# Patient Record
Sex: Female | Born: 1980 | Marital: Single | Smoking: Never smoker
Health system: Southern US, Community
[De-identification: ages and names within clinical notes are randomized; demographics above are authoritative.]

## PROBLEM LIST (undated history)

## (undated) ENCOUNTER — Emergency Department (HOSPITAL_COMMUNITY): Admission: EM | Payer: Self-pay | Source: Home / Self Care

## (undated) DIAGNOSIS — D649 Anemia, unspecified: Secondary | ICD-10-CM

## (undated) HISTORY — DX: Anemia, unspecified: D64.9

---

## 2012-07-27 ENCOUNTER — Other Ambulatory Visit (HOSPITAL_COMMUNITY)
Admission: RE | Admit: 2012-07-27 | Discharge: 2012-07-27 | Disposition: A | Discharge status: Home / Self Care | Payer: 59 | Source: Ambulatory Visit | Attending: Family Medicine | Admitting: Family Medicine

## 2012-07-27 DIAGNOSIS — Z01419 Encounter for gynecological examination (general) (routine) without abnormal findings: Secondary | ICD-10-CM | POA: Insufficient documentation

## 2014-01-23 ENCOUNTER — Encounter (HOSPITAL_COMMUNITY): Payer: Self-pay | Admitting: Emergency Medicine

## 2014-01-23 ENCOUNTER — Emergency Department (HOSPITAL_COMMUNITY): Payer: 59

## 2014-01-23 ENCOUNTER — Emergency Department (HOSPITAL_COMMUNITY)
Admission: EM | Admit: 2014-01-23 | Discharge: 2014-01-23 | Disposition: A | Discharge status: Home / Self Care | Payer: 59 | Attending: Emergency Medicine | Admitting: Emergency Medicine

## 2014-01-23 DIAGNOSIS — Z3202 Encounter for pregnancy test, result negative: Secondary | ICD-10-CM | POA: Insufficient documentation

## 2014-01-23 DIAGNOSIS — Z9889 Other specified postprocedural states: Secondary | ICD-10-CM | POA: Insufficient documentation

## 2014-01-23 DIAGNOSIS — R1013 Epigastric pain: Secondary | ICD-10-CM | POA: Insufficient documentation

## 2014-01-23 DIAGNOSIS — R1033 Periumbilical pain: Secondary | ICD-10-CM | POA: Insufficient documentation

## 2014-01-23 DIAGNOSIS — G8929 Other chronic pain: Secondary | ICD-10-CM | POA: Insufficient documentation

## 2014-01-23 DIAGNOSIS — R109 Unspecified abdominal pain: Secondary | ICD-10-CM

## 2014-01-23 LAB — COMPREHENSIVE METABOLIC PANEL
ALBUMIN: 3.7 g/dL (ref 3.5–5.2)
ALK PHOS: 31 U/L — AB (ref 39–117)
ALT: 13 U/L (ref 0–35)
AST: 16 U/L (ref 0–37)
BILIRUBIN TOTAL: 0.4 mg/dL (ref 0.3–1.2)
BUN: 10 mg/dL (ref 6–23)
CO2: 25 meq/L (ref 19–32)
Calcium: 9.4 mg/dL (ref 8.4–10.5)
Chloride: 100 mEq/L (ref 96–112)
Creatinine, Ser: 0.68 mg/dL (ref 0.50–1.10)
GFR calc Af Amer: >90 mL/min (ref >90)
Glucose, Bld: 83 mg/dL (ref 70–99)
POTASSIUM: 4.4 meq/L (ref 3.7–5.3)
Sodium: 135 mEq/L — ABNORMAL LOW (ref 137–147)
Total Protein: 7.2 g/dL (ref 6.0–8.3)

## 2014-01-23 LAB — URINALYSIS, ROUTINE W REFLEX MICROSCOPIC
Bilirubin Urine: NEGATIVE
Glucose, UA: NEGATIVE mg/dL
Hgb urine dipstick: NEGATIVE
Ketones, ur: NEGATIVE mg/dL
Leukocytes, UA: NEGATIVE
Nitrite: NEGATIVE
Protein, ur: NEGATIVE mg/dL
Specific Gravity, Urine: 1.006 (ref 1.005–1.030)
Urobilinogen, UA: 0.2 mg/dL (ref 0.0–1.0)
pH: 7.5 (ref 5.0–8.0)

## 2014-01-23 LAB — CBC
HEMATOCRIT: 37.9 % (ref 36.0–46.0)
Hemoglobin: 12.6 g/dL (ref 12.0–15.0)
MCH: 24 pg — ABNORMAL LOW (ref 26.0–34.0)
MCHC: 33.2 g/dL (ref 30.0–36.0)
MCV: 72.3 fL — AB (ref 78.0–100.0)
PLATELETS: 285 10*3/uL (ref 150–400)
RBC: 5.24 MIL/uL — ABNORMAL HIGH (ref 3.87–5.11)
RDW: 13.5 % (ref 11.5–15.5)
WBC: 5.9 10*3/uL (ref 4.0–10.5)

## 2014-01-23 LAB — PREGNANCY, URINE: PREG TEST UR: NEGATIVE

## 2014-01-23 LAB — LIPASE, BLOOD: Lipase: 34 U/L (ref 11–59)

## 2014-01-23 MED ORDER — ONDANSETRON HCL 4 MG PO TABS: 4.0000 mg | ORAL_TABLET | Freq: Four times a day (QID) | ORAL | Status: DC

## 2014-01-23 MED ORDER — ONDANSETRON 4 MG PO TBDP
4.0000 mg | ORAL_TABLET | Freq: Once | ORAL | Status: AC
  Administered 2014-01-23: 4 mg via ORAL
  Filled 2014-01-23: qty 1

## 2014-01-23 MED ORDER — RANITIDINE HCL 150 MG PO TABS: 150.0000 mg | ORAL_TABLET | Freq: Two times a day (BID) | ORAL | Status: DC

## 2014-01-23 NOTE — ED Notes (Signed)
Pt c/o intermittent abd pain for a few years, denies n/v/d. Denies vaginal bleeding or discharge. Last BM 01/22/14

## 2014-01-23 NOTE — Discharge Instructions (Signed)
Return to the ED with any concerns including vomiting and not able to keep down liquids, worsening pain- especially if it localizes to the right lower abdomen, or any other alarming symptoms

## 2014-01-23 NOTE — ED Provider Notes (Signed)
CSN: [id number]     Arrival date & time 01/23/14  1243 History   First MD Initiated Contact with Patient 01/23/14 1322     Chief Complaint  Patient presents with  . Abdominal Pain     (Consider location/radiation/quality/duration/timing/severity/associated sxs/prior Treatment) HPI Pt presenting with c/o intermittent abdominal pain.  Sh estates she has been having these symptoms over the past several years. Pain is epigastric and periumblicial when it is present.  She states she feels that she has increased gas during these times.  Does not feel it is worse with certain foods or alcohol.  No vomiting but sometimes has nausea.  No changes in stools- she states she has one formed stool daily.  No fever/chills.  Last night the pain came again which prompted ED visit today.  No current abdominal pain.  No dysuria.  No vaginal discharge or vaginal bleeding.  There are no other associated systemic symptoms, there are no other alleviating or modifying factors.   History reviewed. No pertinent past medical history. Past Surgical History  Procedure Laterality Date  . Cesarean section     No family history on file. History  Substance Use Topics  . Smoking status: Passive Smoke Exposure - Never Smoker  . Smokeless tobacco: Not on file  . Alcohol Use: No   OB History   Grav Para Term Preterm Abortions TAB SAB Ect Mult Living                 Review of Systems ROS reviewed and all otherwise negative except for mentioned in HPI    Allergies  Review of patient's allergies indicates no known allergies.  Home Medications   Prior to Admission medications   Not on File   BP 103/65  Pulse 99  Temp(Src) 98.5 F (36.9 C) (Oral)  Resp 20  SpO2 97%  LMP 12/26/2013 Vitals reviewed Physical Exam Physical Examination: General appearance - alert, well appearing, and in no distress Mental status - alert, oriented to person, place, and time Eyes - no conjunctval injection, no scleral  icterus Mouth - mucous membranes moist, pharynx normal without lesions Chest - clear to auscultation, no wheezes, rales or rhonchi, symmetric air entry Heart - normal rate, regular rhythm, normal S1, S2, no murmurs, rubs, clicks or gallops Abdomen - soft, nontender, nondistended, no masses or organomegaly, nabs Extremities - peripheral pulses normal, no pedal edema, no clubbing or cyanosis Skin - normal coloration and turgor, no rashes  ED Course  Procedures (including critical care time) Labs Review Labs Reviewed  CBC - Abnormal; Notable for the following:    RBC 5.24 (*)    MCV 72.3 (*)    MCH 24.0 (*)    All other components within normal limits  COMPREHENSIVE METABOLIC PANEL - Abnormal; Notable for the following:    Sodium 135 (*)    Alkaline Phosphatase 31 (*)    All other components within normal limits  URINALYSIS, ROUTINE W REFLEX MICROSCOPIC  PREGNANCY, URINE  LIPASE, BLOOD    Imaging Review Dg Abd 2 Views  01/23/2014   CLINICAL DATA:  Left lower quadrant pain for 2 days.  EXAM: ABDOMEN - 2 VIEW  COMPARISON:  None.  FINDINGS: The bowel gas pattern is normal. There is no evidence of free air. No radio-opaque calculi or other significant radiographic abnormality is seen.  IMPRESSION: Negative.   Electronically Signed   By: Amie Portlandavid  Ormond M.D.   On: 01/23/2014 15:00     EKG Interpretation None  MDM   Final diagnoses:  Abdominal pain    Pt presenting with chronic intermittent abdominal pain, she has no tenderness now on exam   Patient is overall nontoxic and well hydrated in appearance. Her labs are reassuring.  abd xray is negative.  Pt advised to followup as an outpatient for further evaluation.  Will give rx for zantac and zofran.  Discharged with strict return precautions.  Pt agreeable with plan.     Ethelda Chick, MD 01/24/14 [phone]

## 2014-01-23 NOTE — ED Notes (Signed)
Patient transported to X-ray 

## 2014-01-23 NOTE — ED Notes (Signed)
MD at bedside. 

## 2014-03-28 ENCOUNTER — Other Ambulatory Visit: Payer: Self-pay | Admitting: Family Medicine

## 2014-03-28 DIAGNOSIS — R1011 Right upper quadrant pain: Secondary | ICD-10-CM

## 2014-04-01 ENCOUNTER — Ambulatory Visit
Admission: RE | Admit: 2014-04-01 | Discharge: 2014-04-01 | Disposition: A | Discharge status: Home / Self Care | Payer: 59 | Source: Ambulatory Visit | Attending: Family Medicine | Admitting: Family Medicine

## 2014-04-01 DIAGNOSIS — R1011 Right upper quadrant pain: Secondary | ICD-10-CM

## 2014-07-23 IMAGING — CR DG ABDOMEN 2V
2 series · 2 of 2 positions shown · non-contrast
Comparison: None.

CLINICAL DATA: Left lower quadrant pain for 2 days.

EXAM:
ABDOMEN - 2 VIEW

[w abdomen upright]
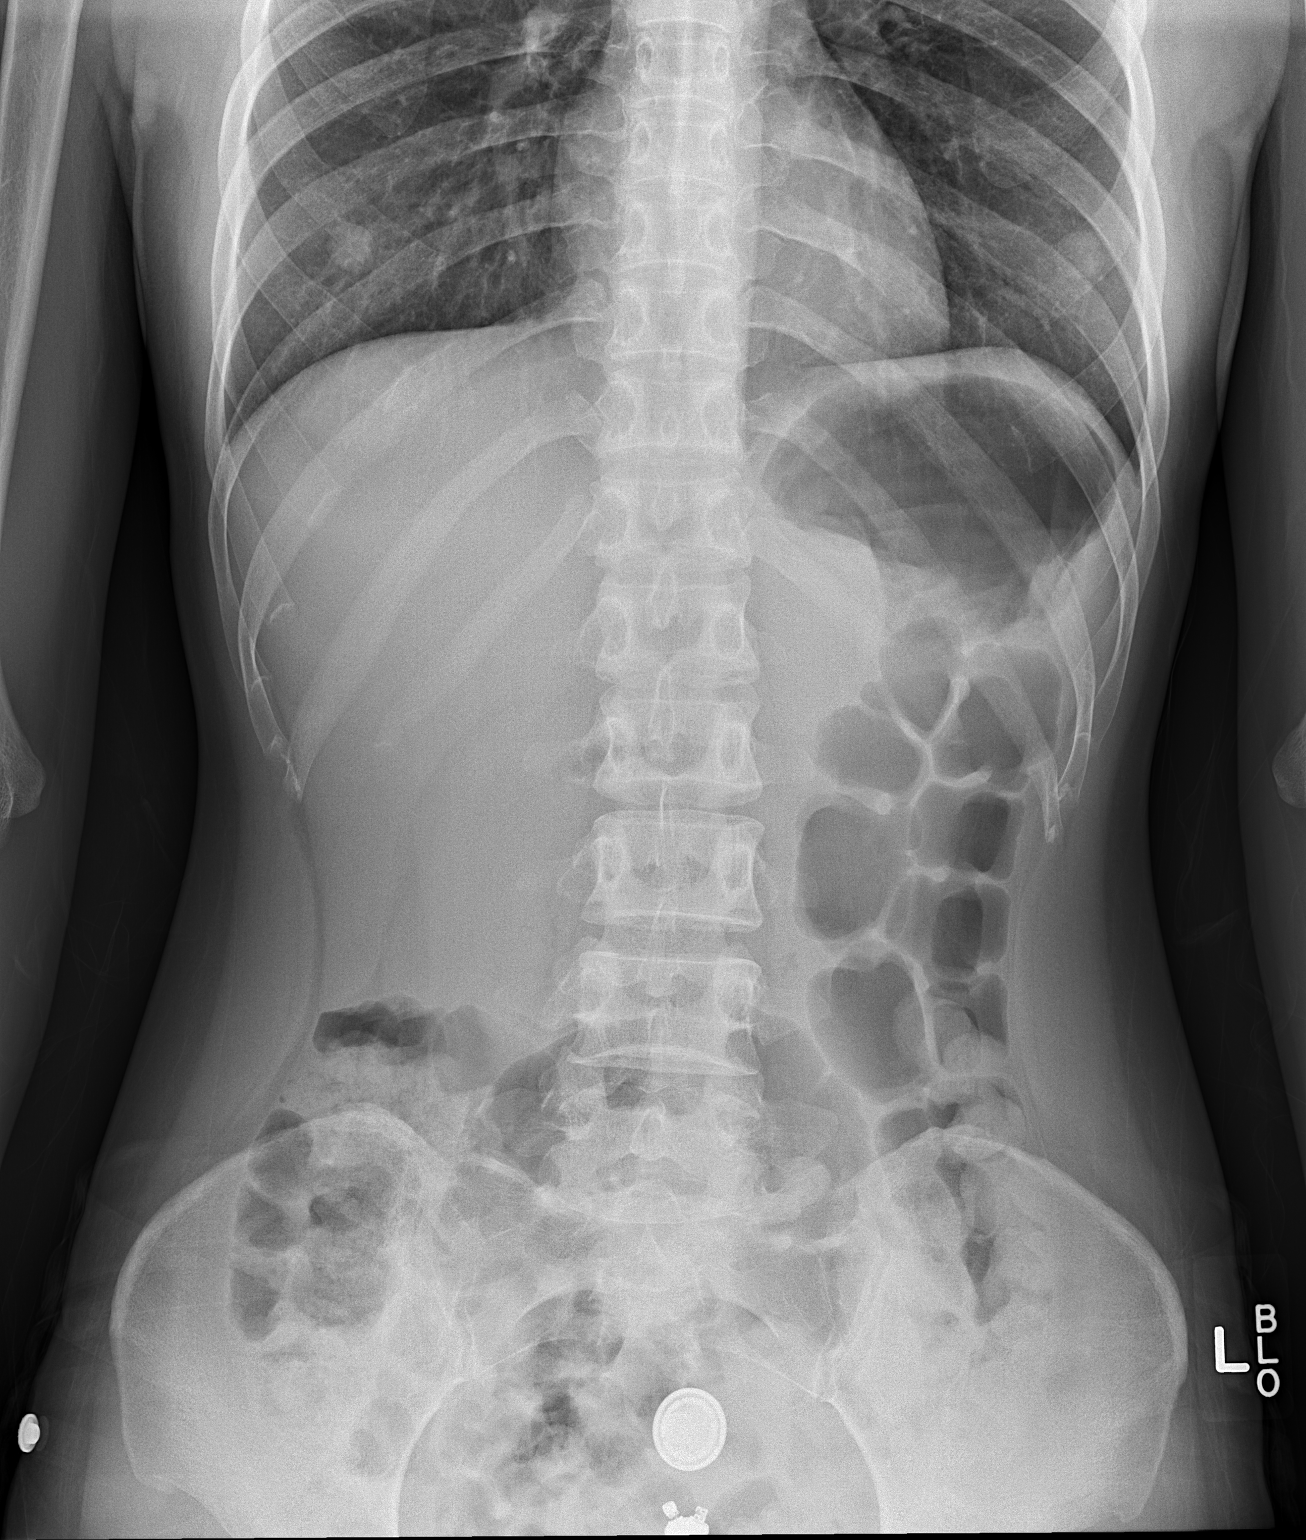

[t abdomen supine]
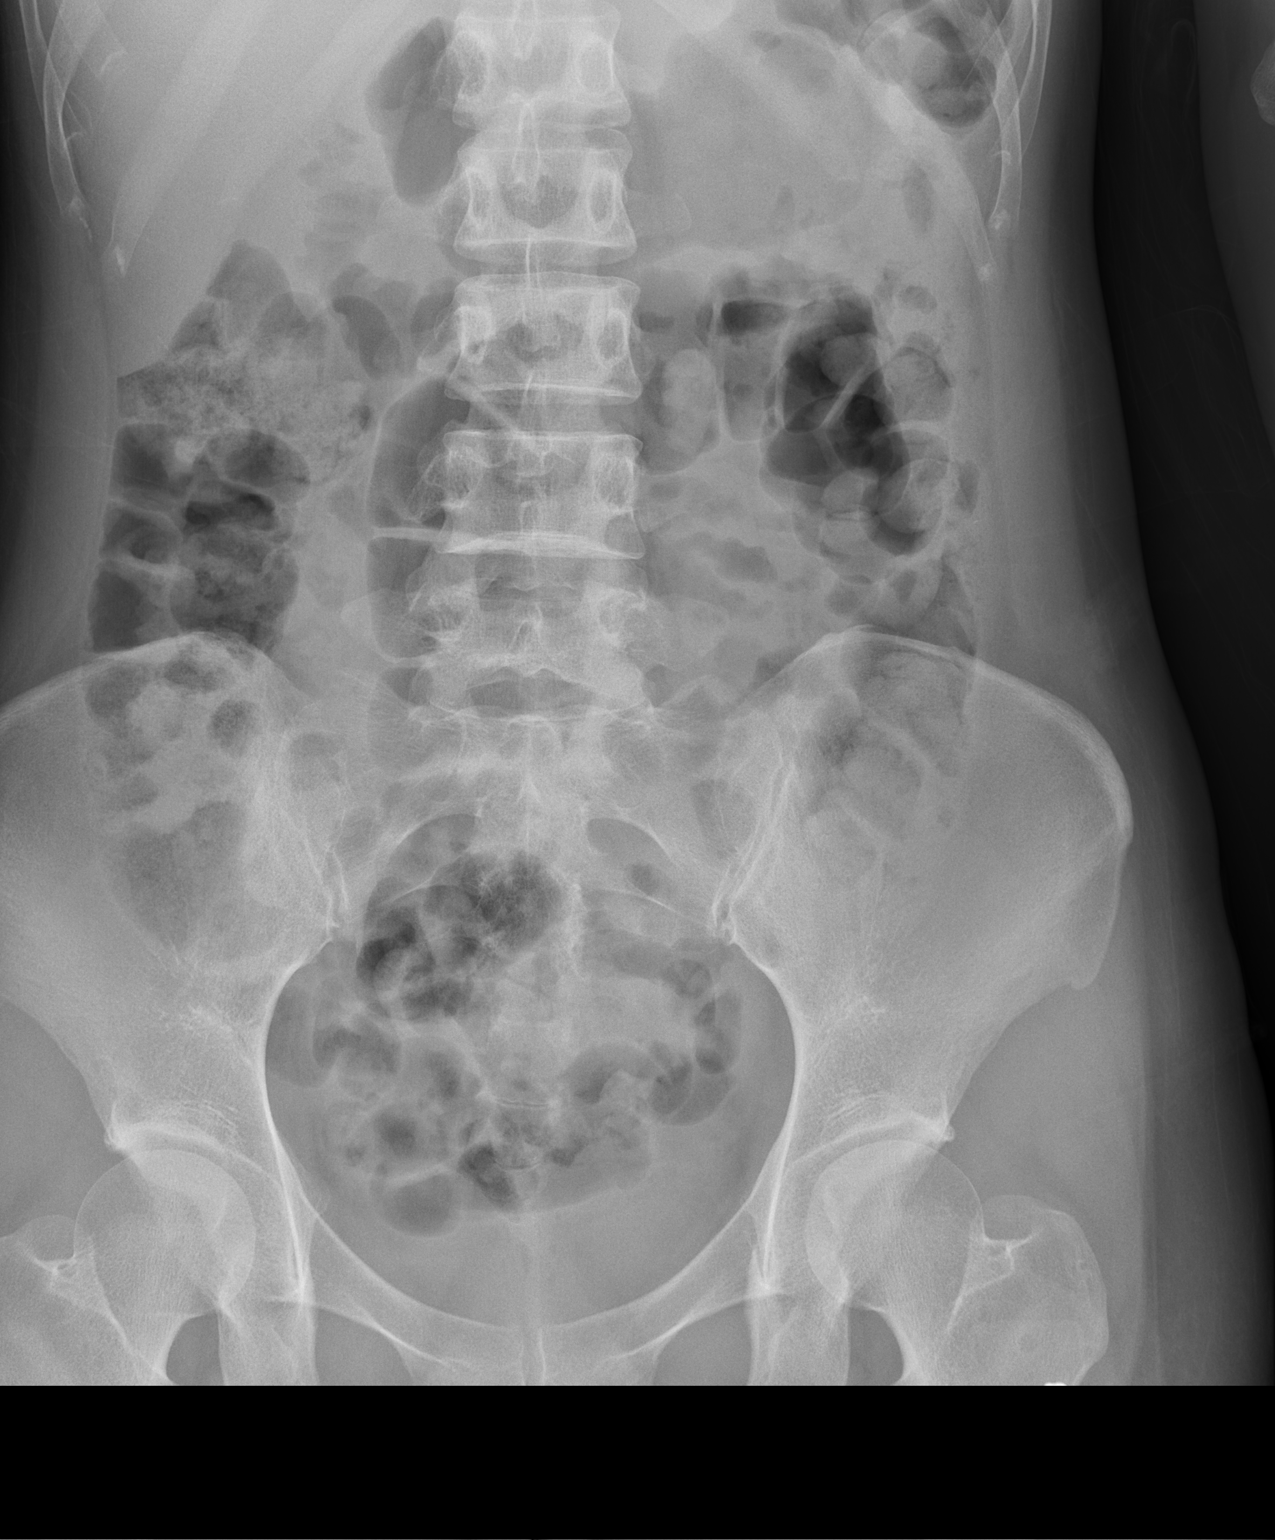

[2 of 2 positions shown; findings below may reference images not displayed]

FINDINGS: The bowel gas pattern is normal. There is no evidence of free air.
No radio-opaque calculi or other significant radiographic
abnormality is seen.
IMPRESSION: Negative.

## 2014-09-29 IMAGING — US US ABDOMEN LIMITED
1 series · 14 of 25 positions shown · non-contrast
Comparison: None.

CLINICAL DATA: Upper abdominal pain

EXAM:
US ABDOMEN LIMITED - RIGHT UPPER QUADRANT

[Series 1: us abdomen limited · 0.21mm/px · 14 of 44 slices shown]
[im 1/44]
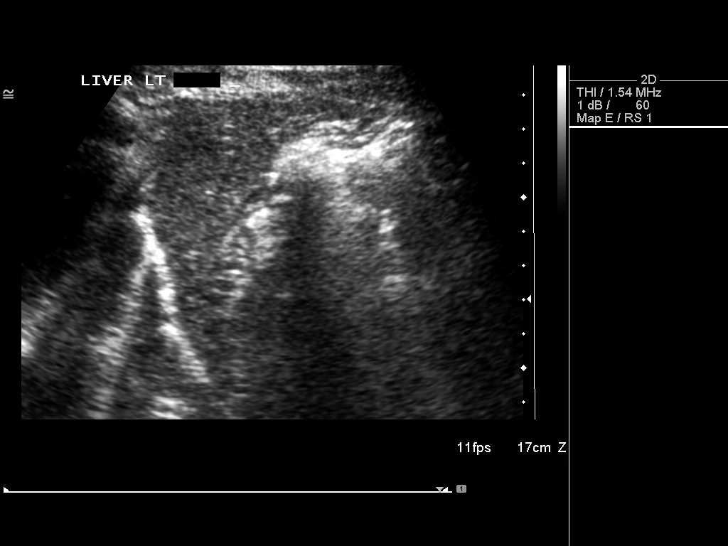
[im 4/44]
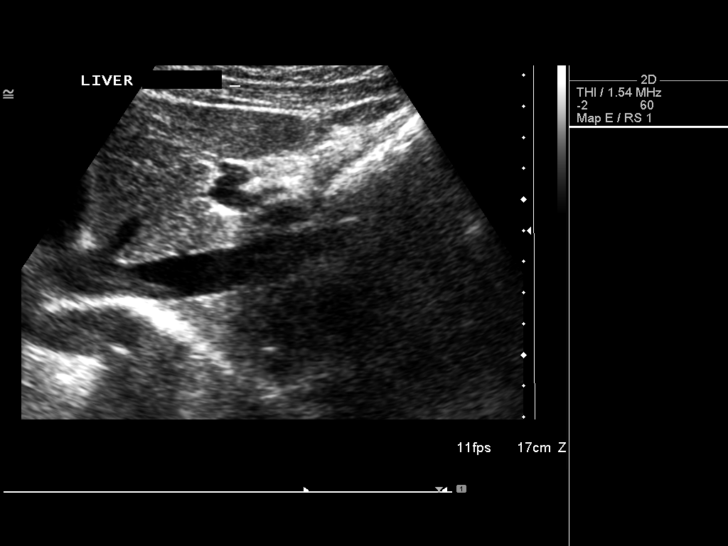
[im 8/44]
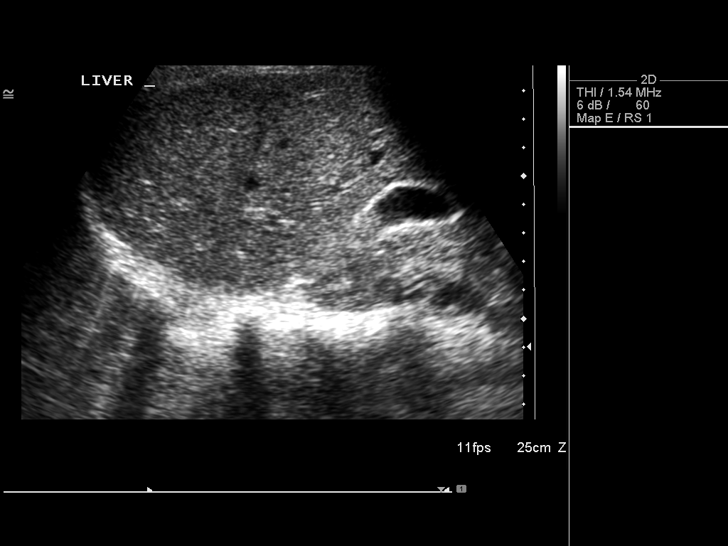
[im 11/44]
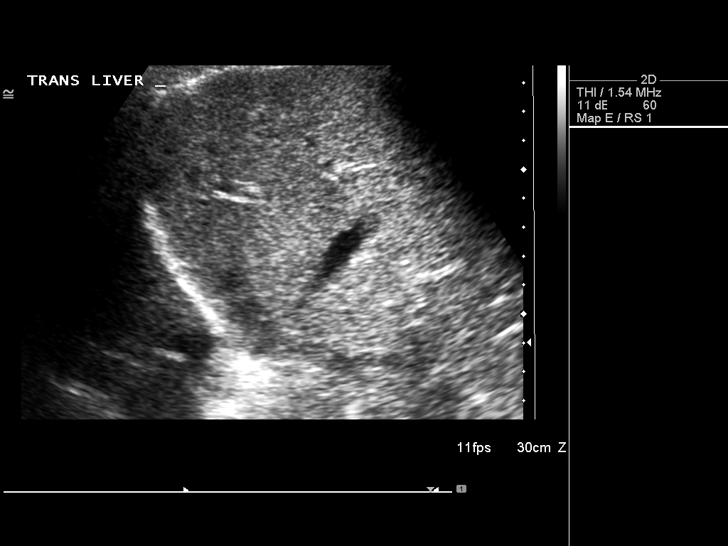
[im 15/44]
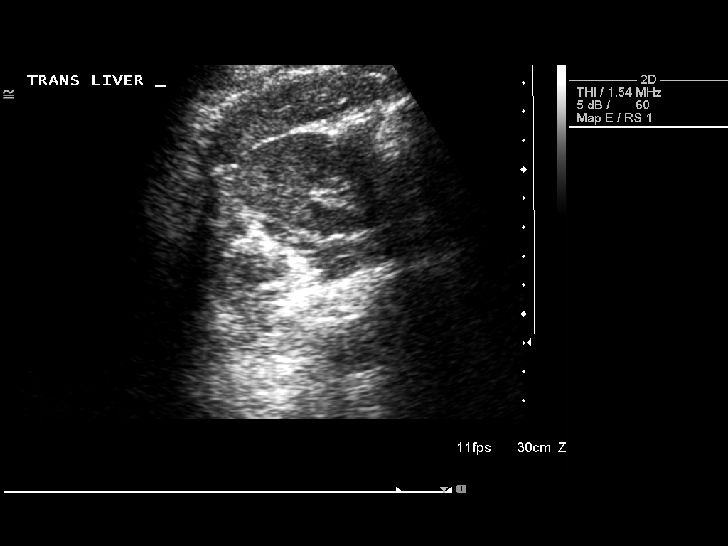
[im 17/44]
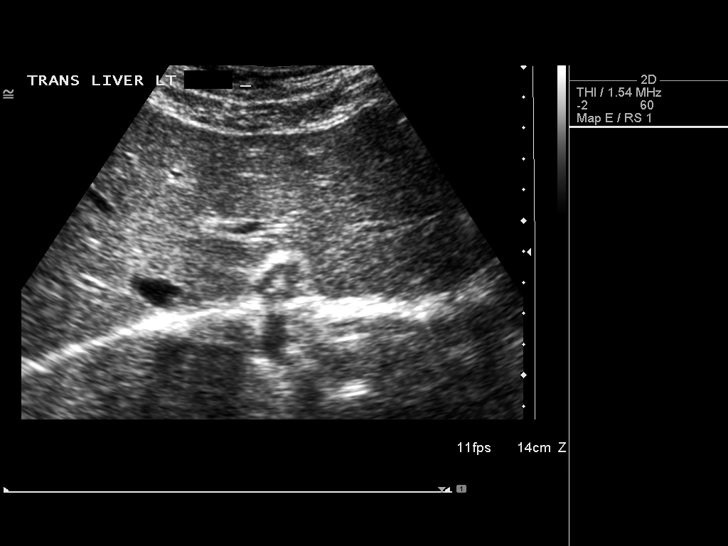
[im 20/44]
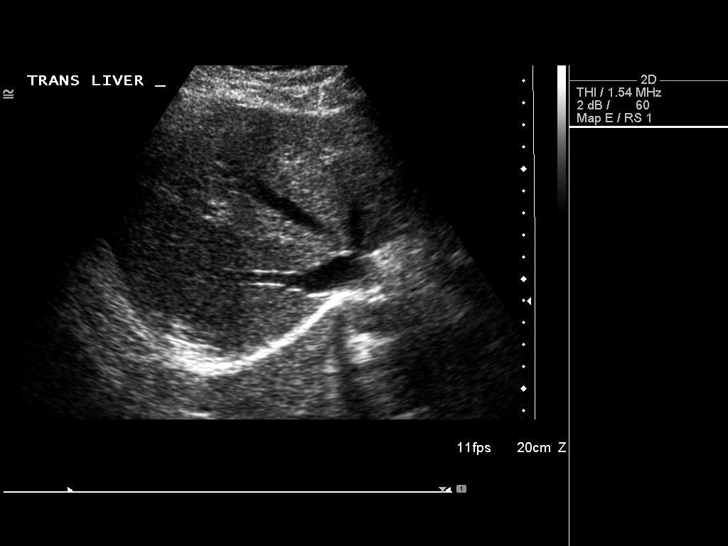
[im 24/44]
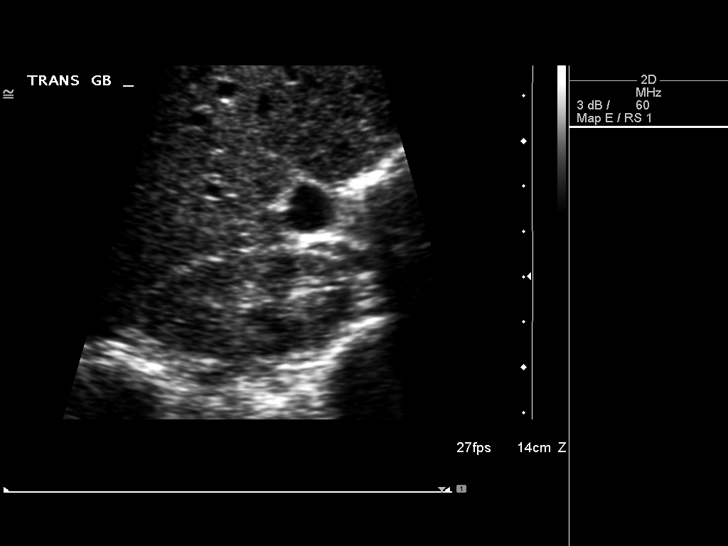
[im 27/44]
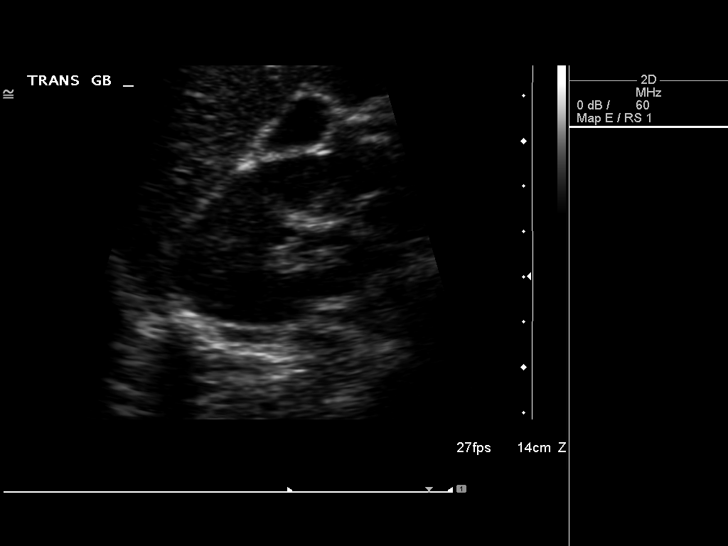
[im 29/44]
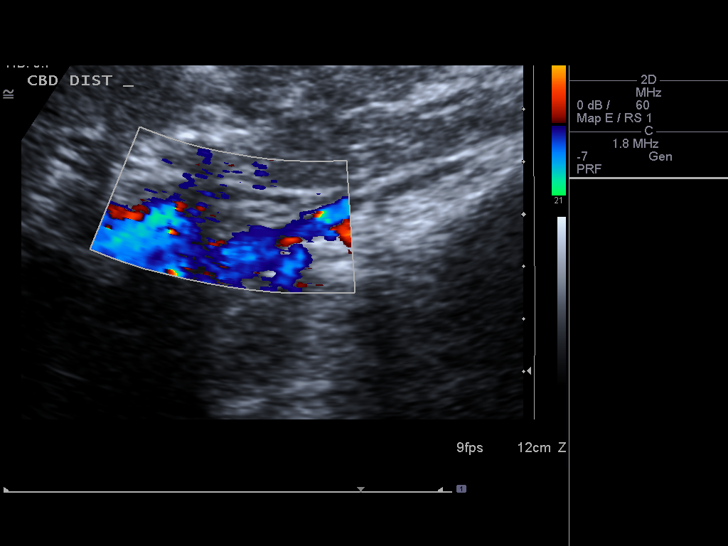
[im 33/44]
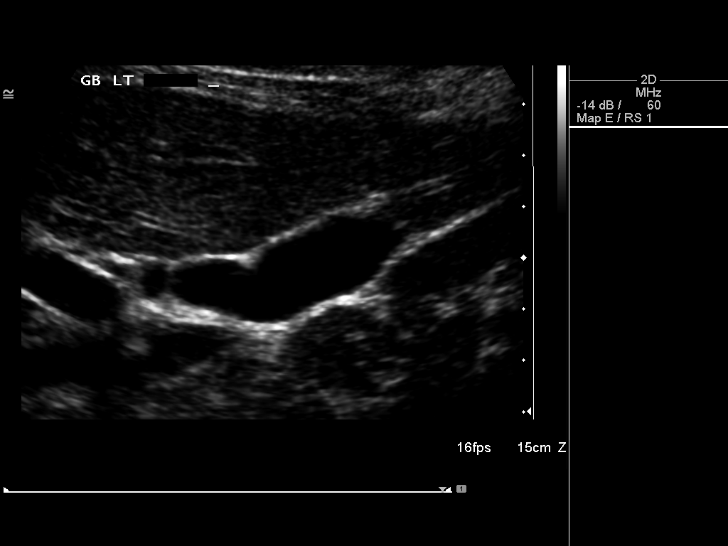
[im 36/44]
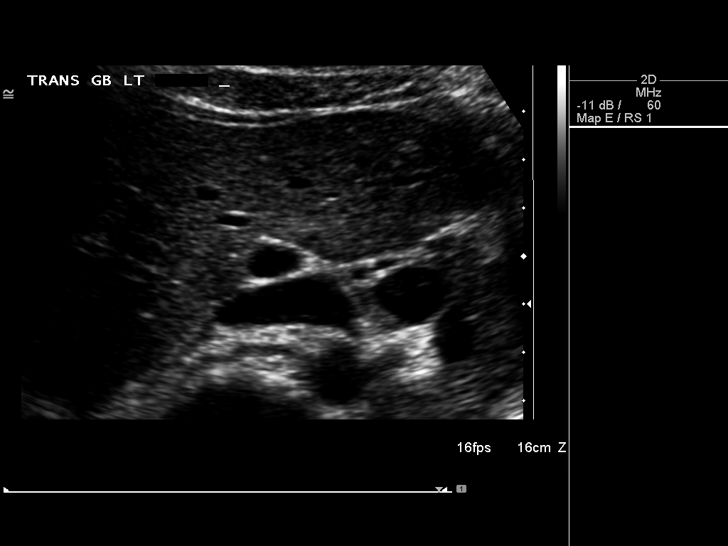
[im 40/44]
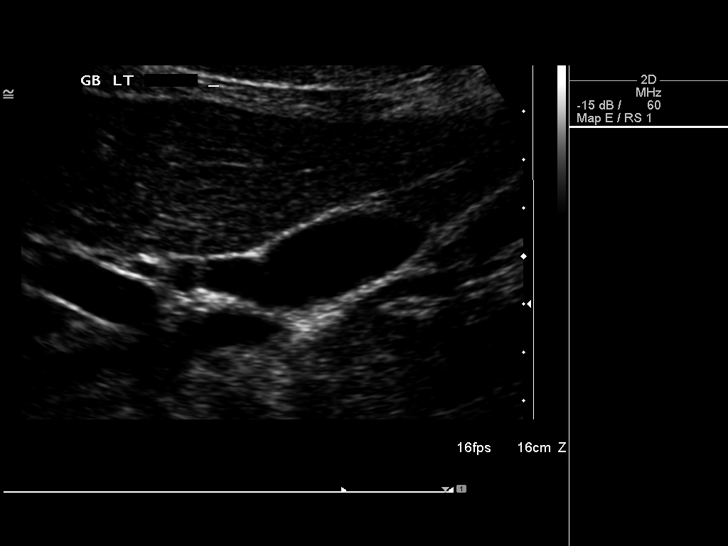
[im 44/44]
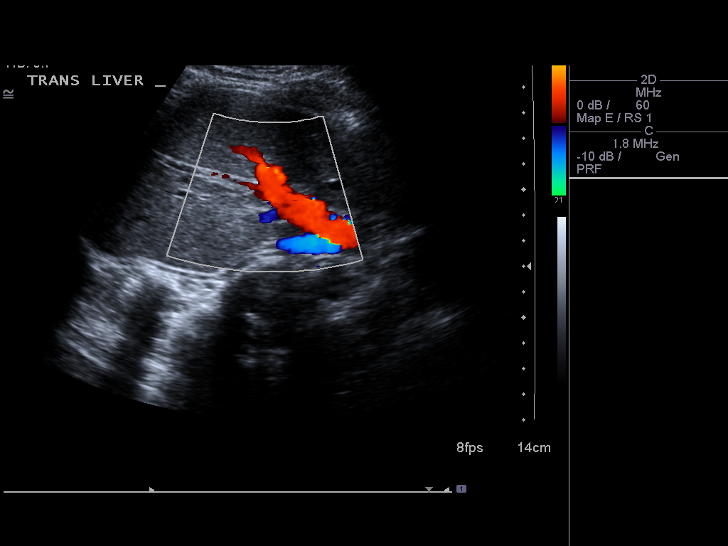

[14 of 25 positions shown; findings below may reference images not displayed]

FINDINGS: Gallbladder:

No gallstones or wall thickening visualized. There is no
pericholecystic fluid. No sonographic Murphy sign noted.

Common bile duct:

Diameter: 3 mm. There is no intrahepatic or extrahepatic biliary
duct dilatation.

Liver:

No focal lesion identified. Within normal limits in parenchymal
echogenicity.
IMPRESSION: Study within normal limits.

## 2015-09-03 HISTORY — PX: COLONOSCOPY: SHX174

## 2016-07-02 ENCOUNTER — Other Ambulatory Visit: Payer: Self-pay | Admitting: Family Medicine

## 2016-07-02 DIAGNOSIS — N644 Mastodynia: Secondary | ICD-10-CM

## 2016-07-09 ENCOUNTER — Ambulatory Visit
Admission: RE | Admit: 2016-07-09 | Discharge: 2016-07-09 | Disposition: A | Discharge status: Home / Self Care | Payer: 59 | Source: Ambulatory Visit | Attending: Family Medicine | Admitting: Family Medicine

## 2016-07-09 DIAGNOSIS — N644 Mastodynia: Secondary | ICD-10-CM

## 2016-09-23 DIAGNOSIS — Z23 Encounter for immunization: Secondary | ICD-10-CM | POA: Diagnosis not present

## 2016-09-23 DIAGNOSIS — Z0000 Encounter for general adult medical examination without abnormal findings: Secondary | ICD-10-CM | POA: Diagnosis not present

## 2016-09-23 DIAGNOSIS — G4700 Insomnia, unspecified: Secondary | ICD-10-CM | POA: Diagnosis not present

## 2017-03-26 DIAGNOSIS — L659 Nonscarring hair loss, unspecified: Secondary | ICD-10-CM | POA: Diagnosis not present

## 2017-03-26 DIAGNOSIS — D649 Anemia, unspecified: Secondary | ICD-10-CM | POA: Diagnosis not present

## 2017-09-24 NOTE — Progress Notes (Signed)
Encounter to establish care and acute visit  Subjective:    Patient ID: Kelly Macdonald, female    DOB: [birth year], 37 y.o.   MRN: [id number]  HPI 37 y.o. very pleasant young female from [name] ([place] in [place] now) presents as new patient and with c/o abdominal pain ongoing for 3-4 years now. She reports this was previously intermittent, but is now experiencing nearly daily for the last 6 months. She reports pain is variable - 5/10 - poorly localized through tends to experience in bilateral upper quadrants - she reports she commonly experiences flank pain and upper back pain simultaneously. She reports pain tends to occur at night or early morning. Not associated with eating or notably particular foods. She reports she will have pain even if she skips dinner.    She also c/o sense of bloating, abdominal fullness limiting appetite, mild nausea accompanying pain. Denies vomiting or diarrhea. She endorses ongoing constipation - currently takes what appears to be a phsyllium supplement which helps regulate bowels. She reports without this she experiences BM q3-4 days. She reports current BM are "normal" - normal shape, consistency, color (dark brown), denies hematochezia/melena, very dark black/tarry stools or mucus.   She has been evaluated for this same problem in 2015 at an ER - plain films, US and labs - CBC, CMP, lipase, urinalysis unremarkable at that time.   She reports weights are stable, denies fever/chills, fatigue/malaise, myalgias - endorses accompanying flank/upper back/shoulder pain simultaneously with abdominal discomfort. Denies rashes.   She was evaluated by physicians in [name] and had a colonoscopy 2 years ago- reports this was unremarkable. She is very concerned about gastric cancer today - she reports this has become very common in young people her age and has a friend with this.   She reports she drinks 2 bottles of water daily. [place] - still  eating typical ethnic foods, though has cut back significantly on spicy foods which did help initially.   BMI is There is no height or weight on file to calculate BMI., she has not been exercising due to this ongoing problem and discomfort.  Wt Readings from Last 3 Encounters:  09/25/17 111 lb 12.8 oz (50.7 kg)    Allergies No Known Allergies  SURGICAL HISTORY She  has a past surgical history that includes Cesarean section and Colonoscopy (2017). FAMILY HISTORY Her family history includes Diabetes in her mother. SOCIAL HISTORY She  reports that  has never smoked. she has never used smokeless tobacco. She reports that she drinks about 1.2 oz of alcohol per week. She reports that she does not use drugs.   Review of Systems  Constitutional: Positive for appetite change. Negative for activity change, chills, diaphoresis, fatigue, fever and unexpected weight change.  HENT: Negative.  Negative for sore throat, trouble swallowing and voice change.   Eyes: Negative.   Respiratory: Negative.  Negative for cough, choking, chest tightness, shortness of breath and wheezing.   Cardiovascular: Negative for chest pain, palpitations and leg swelling.  Gastrointestinal: Positive for abdominal pain, constipation and nausea. Negative for abdominal distention, anal bleeding, blood in stool, diarrhea, rectal pain and vomiting.  Endocrine: Negative for cold intolerance and heat intolerance.  Genitourinary: Positive for flank pain. Negative for decreased urine volume, difficulty urinating, dysuria, enuresis, frequency, hematuria, menstrual problem, pelvic pain and vaginal discharge.  Musculoskeletal: Positive for back pain. Negative for gait problem, joint swelling, myalgias, neck pain and neck stiffness.  Skin: Negative  for color change and rash.  Allergic/Immunologic: Negative for environmental allergies, food allergies and immunocompromised state.  Neurological: Negative for dizziness, syncope,  light-headedness and headaches.  Hematological: Bruises/bleeds easily.  Psychiatric/Behavioral: Negative.        Objective:   Physical Exam  Constitutional: She is oriented to person, place, and time. She appears well-developed and well-nourished. No distress.  HENT:  Head: Normocephalic.  Right Ear: External ear normal.  Left Ear: External ear normal.  Nose: Nose normal.  Mouth/Throat: Oropharynx is clear and moist. No oropharyngeal exudate.  Eyes: Conjunctivae and EOM are normal. Pupils are equal, round, and reactive to light. Right eye exhibits no discharge. Left eye exhibits no discharge. No scleral icterus.  Neck: Normal range of motion. Neck supple. No JVD present. No thyromegaly present.  Cardiovascular: Normal rate, regular rhythm, normal heart sounds and intact distal pulses. Exam reveals no gallop and no friction rub.  No murmur heard. Pulmonary/Chest: Effort normal and breath sounds normal. No respiratory distress. She has no wheezes. She has no rales. She exhibits no tenderness.  Abdominal: Soft. Bowel sounds are normal. She exhibits no distension (Generalized, somewhat increased in LUQ) and no mass. There is tenderness. There is no rebound and no guarding.  Musculoskeletal: Normal range of motion. She exhibits no edema, tenderness or deformity.  Lymphadenopathy:    She has no cervical adenopathy.  Neurological: She is alert and oriented to person, place, and time. She displays normal reflexes. No cranial nerve deficit. She exhibits normal muscle tone. Coordination normal.  Skin: Skin is warm and dry. No rash noted. She is not diaphoretic. No erythema. No pallor.  Psychiatric: She has a normal mood and affect. Her behavior is normal. Judgment and thought content normal.  Vitals reviewed.      Assessment & Plan:   Jamel was seen today for establish care, back pain and abdominal pain.  Diagnoses and all orders for this visit:  Generalized abdominal discomfort/ LUQ  tenderness -     CBC with Differential/Platelet -     BASIC METABOLIC PANEL WITH GFR -     Hepatic function panel -     Urinalysis w microscopic + reflex cultur -     Amylase -     Sedimentation rate       -     Celiac Disease Comprehensive Panel with Reflexes  -     dicyclomine (BENTYL) 10 MG capsule; Take 1 capsule (10 mg total) by mouth 4 (four) times daily for 14 days. -     omeprazole (PRILOSEC) 40 MG capsule; Take 1 capsule every morning for Acid Reflux -     simethicone (GAS-X) 80 MG chewable tablet; Chew 1 tablet (80 mg total) by mouth every 6 (six) hours as needed for flatulence.  -     Reports neg colonoscopy 2017 without access to report -  -     Progressive symptoms, has night time pain - will order CT, pending results anticipate referral to GI  Bloating -     simethicone (GAS-X) 80 MG chewable tablet; Chew 1 tablet (80 mg total) by mouth every 6 (six) hours as needed for flatulence. -     Celiac Disease Comprehensive Panel with Reflexes  No future appointments.

## 2017-09-25 ENCOUNTER — Ambulatory Visit (INDEPENDENT_AMBULATORY_CARE_PROVIDER_SITE_OTHER): Payer: 59 | Admitting: Adult Health

## 2017-09-25 ENCOUNTER — Encounter: Payer: Self-pay | Admitting: Adult Health

## 2017-09-25 VITALS — BP 102/72 | HR 105 | Temp 98.1°F | Wt 111.8 lb

## 2017-09-25 DIAGNOSIS — R140 Abdominal distension (gaseous): Secondary | ICD-10-CM | POA: Diagnosis not present

## 2017-09-25 DIAGNOSIS — R1084 Generalized abdominal pain: Secondary | ICD-10-CM | POA: Diagnosis not present

## 2017-09-25 DIAGNOSIS — R1012 Left upper quadrant pain: Secondary | ICD-10-CM | POA: Diagnosis not present

## 2017-09-25 DIAGNOSIS — R109 Unspecified abdominal pain: Secondary | ICD-10-CM | POA: Diagnosis not present

## 2017-09-25 DIAGNOSIS — R6881 Early satiety: Secondary | ICD-10-CM | POA: Diagnosis not present

## 2017-09-25 DIAGNOSIS — R10812 Left upper quadrant abdominal tenderness: Secondary | ICD-10-CM | POA: Diagnosis not present

## 2017-09-25 MED ORDER — DICYCLOMINE HCL 10 MG PO CAPS: 10.0000 mg | ORAL_CAPSULE | Freq: Four times a day (QID) | ORAL | 0 refills | Status: DC

## 2017-09-25 MED ORDER — SIMETHICONE 80 MG PO CHEW: 80.0000 mg | CHEWABLE_TABLET | Freq: Four times a day (QID) | ORAL | 0 refills | Status: DC | PRN

## 2017-09-25 MED ORDER — OMEPRAZOLE 40 MG PO CPDR: DELAYED_RELEASE_CAPSULE | ORAL | 3 refills | Status: DC

## 2017-09-25 NOTE — Patient Instructions (Addendum)
I am sending in several medications to try -   Prilosec 40 mg - take 1 tab daily 30-60 minutes before breakfast   Bentyl - 10 mg 4 times a day - this helps with crampy abdominal pain   Simethicone - this medication is available over the counter (at pharmacy) - try taking this medication 4 times and day with bentyl     Please go to the ER if you have any severe AB pain, unable to hold down food/water, blood in stool or vomit, chest pain, shortness of breath, or any worsening symptoms.   Consider keeping a food diary- common causes of diarrhea are dairy, certain carbs...  FODMAP stands for fermentable oligo-, di-, mono-saccharides and polyols (1). These are the scientific terms used to classify groups of carbs that are notorious for triggering digestive symptoms like bloating, gas and stomach pain.   FODMAPs are found in a wide range of foods in varying amounts. Some foods contain just one type, while others contain several.  The main dietary sources of the four groups of FODMAPs include:  Oligosaccharides: Wheat, rye, legumes and various fruits and vegetables, such as garlic and onions.  Disaccharides: Milk, yogurt and soft cheese. Lactose is the main carb.  Monosaccharides: Various fruit including figs and mangoes, and sweeteners such as honey and agave nectar. Fructose is the main carb.  Polyols: Certain fruits and vegetables including blackberries and lychee, as well as some low-calorie sweeteners like those in sugar-free gum.   Keep a food diary. This will help you identify foods that cause symptoms. Write down: ? What you eat and when. ? What symptoms you have. ? When symptoms occur in relation to your meals.  Avoid foods that cause symptoms. Talk with your dietitian about other ways to get the same nutrients that are in these foods.  Eat your meals slowly, in a relaxed setting.  Aim to eat 5-6 small meals per day. Do not skip meals.  Drink enough fluids to keep  your urine clear or pale yellow.  Ask your health care provider if you should take an over-the-counter probiotic during flare-ups to help restore healthy gut bacteria.  If you have cramping or diarrhea, try making your meals low in fat and high in carbohydrates. Examples of carbohydrates are pasta, rice, whole grain breads and cereals, fruits, and vegetables.  If dairy products cause your symptoms to flare up, try eating less of them. You might be able to handle yogurt better than other dairy products because it contains bacteria that help with digestion.         Irritable Bowel Syndrome, Adult Irritable bowel syndrome (IBS) is not one specific disease. It is a group of symptoms that affects the organs responsible for digestion (gastrointestinal or GI tract). To regulate how your GI tract works, your body sends signals back and forth between your intestines and your brain. If you have IBS, there may be a problem with these signals. As a result, your GI tract does not function normally. Your intestines may become more sensitive and overreact to certain things. This is especially true when you eat certain foods or when you are under stress. There are four types of IBS. These may be determined based on the consistency of your stool:  IBS with diarrhea.  IBS with constipation.  Mixed IBS.  Unsubtyped IBS.  It is important to know which type of IBS you have. Some treatments are more likely to be helpful for certain types of IBS. What  are the causes? The exact cause of IBS is not known. What increases the risk? You may have a higher risk of IBS if:  You are a woman.  You are younger than 35 years old.  You have a family history of IBS.  You have mental health problems.  You have had bacterial infection of your GI tract.  What are the signs or symptoms? Symptoms of IBS vary from person to person. The main symptom is abdominal pain or discomfort. Additional symptoms usually  include one or more of the following:  Diarrhea, constipation, or both.  Abdominal swelling or bloating.  Feeling full or sick after eating a small or regular-size meal.  Frequent gas.  Mucus in the stool.  A feeling of having more stool left after a bowel movement.  Symptoms tend to come and go. They may be associated with stress, psychiatric conditions, or nothing at all. How is this diagnosed? There is no specific test to diagnose IBS. Your health care provider will make a diagnosis based on a physical exam, medical history, and your symptoms. You may have other tests to rule out other conditions that may be causing your symptoms. These may include:  Blood tests.  X-rays.  CT scan.  Endoscopy and colonoscopy. This is a test in which your GI tract is viewed with a long, thin, flexible tube.  How is this treated? There is no cure for IBS, but treatment can help relieve symptoms. IBS treatment often includes:  Changes to your diet, such as: ? Eating more fiber. ? Avoiding foods that cause symptoms. ? Drinking more water. ? Eating regular, medium-sized portioned meals.  Medicines. These may include: ? Fiber supplements if you have constipation. ? Medicine to control diarrhea (antidiarrheal medicines). ? Medicine to help control muscle spasms in your GI tract (antispasmodic medicines). ? Medicines to help with any mental health issues, such as antidepressants or tranquilizers.  Therapy. ? Talk therapy may help with anxiety, depression, or other mental health issues that can make IBS symptoms worse.  Stress reduction. ? Managing your stress can help keep symptoms under control.  Follow these instructions at home:  Take medicines only as directed by your health care provider.  Eat a healthy diet. ? Avoid foods and drinks with added sugar. ? Include more whole grains, fruits, and vegetables gradually into your diet. This may be especially helpful if you have IBS with  constipation. ? Avoid any foods and drinks that make your symptoms worse. These may include dairy products and caffeinated or carbonated drinks. ? Do not eat large meals. ? Drink enough fluid to keep your urine clear or pale yellow.  Exercise regularly. Ask your health care provider for recommendations of good activities for you.  Keep all follow-up visits as directed by your health care provider. This is important. Contact a health care provider if:  You have constant pain.  You have trouble or pain with swallowing.  You have worsening diarrhea. Get help right away if:  You have severe and worsening abdominal pain.  You have diarrhea and: ? You have a rash, stiff neck, or severe headache. ? You are irritable, sleepy, or difficult to awaken. ? You are weak, dizzy, or extremely thirsty.  You have bright red blood in your stool or you have black tarry stools.  You have unusual abdominal swelling that is painful.  You vomit continuously.  You vomit blood (hematemesis).  You have both abdominal pain and a fever. This information is not  intended to replace advice given to you by your health care provider. Make sure you discuss any questions you have with your health care provider. Document Released: 08/19/2005 Document Revised: 01/19/2016 Document Reviewed: 05/06/2014 Elsevier Interactive Patient Education  2018 [employer]    Probiotics What are probiotics? Probiotics are the good bacteria and yeasts that live in your body and keep you and your digestive system healthy. Probiotics also help your body's defense (immune) system and protect your body against bad bacterial growth. Certain foods contain probiotics, such as yogurt. Probiotics can also be purchased as a supplement. As with any supplement or drug, it is important to discuss its use with your health care provider. What affects the balance of bacteria in my body? The balance of bacteria in your body can be affected  by:  Antibiotic medicines. Antibiotics are sometimes necessary to treat infection. Unfortunately, they may kill good or friendly bacteria in your body as well as the bad bacteria. This may lead to stomach problems like diarrhea, gas, and cramping.  Disease. Some conditions are the result of an overgrowth of bad bacteria, yeasts, parasites, or fungi. These conditions include: ? Infectious diarrhea. ? Stomach and respiratory infections. ? Skin infections. ? Irritable bowel syndrome (IBS). ? Inflammatory bowel diseases. ? Ulcer due to Helicobacter pylori (H. pylori) infection. ? Tooth decay and periodontal disease. ? Vaginal infections.  Stress and poor diet may also lower the good bacteria in your body. What type of probiotic is right for me? Probiotics are available over the counter at your local pharmacy, health food, or grocery store. They come in many different forms, combinations of strains, and dosing strengths. Some may need to be refrigerated. Always read the label for storage and usage instructions. Specific strains have been shown to be more effective for certain conditions. Ask your health care provider what option is best for you. Why would I need probiotics? There are many reasons your health care provider might recommend a probiotic supplement, including:  Diarrhea.  Constipation.  IBS.  Respiratory infections.  Yeast infections.  Acne, eczema, and other skin conditions.  Frequent urinary tract infections (UTIs).  Are there side effects of probiotics? Some people experience mild side effects when taking probiotics. Side effects are usually temporary and may include:  Gas.  Bloating.  Cramping.  Rarely, serious side effects, such as infection or immune system changes, may occur. What else do I need to know about probiotics?  There are many different strains of probiotics. Certain strains may be more effective depending on your condition. Probiotics are  available in varying doses. Ask your health care provider which probiotic you should use and how often.  If you are taking probiotics along with antibiotics, it is generally recommended to wait at least 2 hours between taking the antibiotic and taking the probiotic. For more information: [facility] http://potts.com/http://nccam.nih.gov/ This information is not intended to replace advice given to you by your health care provider. Make sure you discuss any questions you have with your health care provider. Document Released: 03/16/2014 Document Revised: 07/16/2016 Document Reviewed: 11/16/2013 Elsevier Interactive Patient Education  2017 [employer]

## 2017-09-26 LAB — URINALYSIS W MICROSCOPIC + REFLEX CULTURE
Bacteria, UA: NONE SEEN /HPF
Bilirubin Urine: NEGATIVE
GLUCOSE, UA: NEGATIVE
HGB URINE DIPSTICK: NEGATIVE
HYALINE CAST: NONE SEEN /LPF
Ketones, ur: NEGATIVE
Leukocyte Esterase: NEGATIVE
Nitrites, Initial: NEGATIVE
PH: 6 (ref 5.0–8.0)
Protein, ur: NEGATIVE
RBC / HPF: NONE SEEN /HPF (ref 0–2)
SPECIFIC GRAVITY, URINE: 1.005 (ref 1.001–1.03)
Squamous Epithelial / LPF: NONE SEEN /HPF (ref <=5)
WBC UA: NONE SEEN /HPF (ref 0–5)

## 2017-09-26 LAB — CBC WITH DIFFERENTIAL/PLATELET
BASOS PCT: 0.6 %
Basophils Absolute: 42 cells/uL (ref 0–200)
Eosinophils Absolute: 77 cells/uL (ref 15–500)
Eosinophils Relative: 1.1 %
HCT: 41.3 % (ref 35.0–45.0)
Hemoglobin: 13.5 g/dL (ref 11.7–15.5)
Lymphs Abs: 2310 cells/uL (ref 850–3900)
MCH: 24 pg — ABNORMAL LOW (ref 27.0–33.0)
MCHC: 32.7 g/dL (ref 32.0–36.0)
MCV: 73.4 fL — ABNORMAL LOW (ref 80.0–100.0)
MPV: 9.4 fL (ref 7.5–12.5)
Monocytes Relative: 9.2 %
Neutro Abs: 3927 cells/uL (ref 1500–7800)
Neutrophils Relative %: 56.1 %
PLATELETS: 363 10*3/uL (ref 140–400)
RBC: 5.63 10*6/uL — ABNORMAL HIGH (ref 3.80–5.10)
RDW: 13.2 % (ref 11.0–15.0)
TOTAL LYMPHOCYTE: 33 %
WBC mixed population: 644 cells/uL (ref 200–950)
WBC: 7 10*3/uL (ref 3.8–10.8)

## 2017-09-26 LAB — AMYLASE: Amylase: 60 U/L (ref 21–101)

## 2017-09-26 LAB — HEPATIC FUNCTION PANEL
AG Ratio: 1.5 (calc) (ref 1.0–2.5)
ALBUMIN MSPROF: 4.7 g/dL (ref 3.6–5.1)
ALKALINE PHOSPHATASE (APISO): 48 U/L (ref 33–115)
ALT: 11 U/L (ref 6–29)
AST: 15 U/L (ref 10–30)
Bilirubin, Direct: 0.1 mg/dL (ref 0.0–0.2)
Globulin: 3.2 g/dL (calc) (ref 1.9–3.7)
Indirect Bilirubin: 0.4 mg/dL (calc) (ref 0.2–1.2)
Total Bilirubin: 0.5 mg/dL (ref 0.2–1.2)
Total Protein: 7.9 g/dL (ref 6.1–8.1)

## 2017-09-26 LAB — BASIC METABOLIC PANEL WITH GFR
BUN: 13 mg/dL (ref 7–25)
CALCIUM: 10.1 mg/dL (ref 8.6–10.2)
CO2: 28 mmol/L (ref 20–32)
Chloride: 103 mmol/L (ref 98–110)
Creat: 0.92 mg/dL (ref 0.50–1.10)
GFR, Est African American: 94 mL/min/{1.73_m2} (ref >=60)
GFR, Est Non African American: 81 mL/min/{1.73_m2} (ref >=60)
Glucose, Bld: 84 mg/dL (ref 65–99)
POTASSIUM: 5.1 mmol/L (ref 3.5–5.3)
SODIUM: 138 mmol/L (ref 135–146)

## 2017-09-26 LAB — NO CULTURE INDICATED

## 2017-09-29 ENCOUNTER — Encounter: Payer: Self-pay | Admitting: Adult Health

## 2017-09-30 ENCOUNTER — Other Ambulatory Visit: Payer: Self-pay | Admitting: Adult Health

## 2017-09-30 DIAGNOSIS — R1084 Generalized abdominal pain: Secondary | ICD-10-CM

## 2017-10-08 ENCOUNTER — Ambulatory Visit
Admission: RE | Admit: 2017-10-08 | Discharge: 2017-10-08 | Disposition: A | Discharge status: Home / Self Care | Payer: 59 | Source: Ambulatory Visit | Attending: Adult Health | Admitting: Adult Health

## 2017-10-08 DIAGNOSIS — K5900 Constipation, unspecified: Secondary | ICD-10-CM | POA: Diagnosis not present

## 2017-10-08 DIAGNOSIS — R1084 Generalized abdominal pain: Secondary | ICD-10-CM

## 2017-10-08 MED ORDER — IOPAMIDOL (ISOVUE-300) INJECTION 61%
100.0000 mL | Freq: Once | INTRAVENOUS | Status: AC | PRN
  Administered 2017-10-08: 100 mL via INTRAVENOUS

## 2017-10-09 ENCOUNTER — Other Ambulatory Visit: Payer: Self-pay | Admitting: Adult Health

## 2017-10-09 ENCOUNTER — Encounter: Payer: Self-pay | Admitting: Adult Health

## 2017-10-09 DIAGNOSIS — R1084 Generalized abdominal pain: Secondary | ICD-10-CM

## 2017-10-15 ENCOUNTER — Other Ambulatory Visit: Payer: Self-pay

## 2017-10-15 ENCOUNTER — Encounter: Payer: Self-pay | Admitting: Adult Health

## 2017-10-15 DIAGNOSIS — Z1211 Encounter for screening for malignant neoplasm of colon: Secondary | ICD-10-CM

## 2017-10-15 LAB — POC HEMOCCULT BLD/STL (HOME/3-CARD/SCREEN)
Card #3 Fecal Occult Blood, POC: NEGATIVE
FECAL OCCULT BLD: NEGATIVE
FECAL OCCULT BLD: NEGATIVE

## 2017-10-16 DIAGNOSIS — Z1212 Encounter for screening for malignant neoplasm of rectum: Secondary | ICD-10-CM | POA: Diagnosis not present

## 2017-10-27 ENCOUNTER — Other Ambulatory Visit: Payer: Self-pay | Admitting: Adult Health

## 2017-10-27 DIAGNOSIS — R1084 Generalized abdominal pain: Secondary | ICD-10-CM

## 2017-10-27 MED ORDER — DICYCLOMINE HCL 10 MG PO CAPS: 10.0000 mg | ORAL_CAPSULE | Freq: Four times a day (QID) | ORAL | 3 refills | Status: DC | PRN

## 2017-12-02 ENCOUNTER — Encounter: Payer: Self-pay | Admitting: Physician Assistant

## 2017-12-18 ENCOUNTER — Ambulatory Visit: Payer: 59 | Admitting: Physician Assistant

## 2017-12-24 ENCOUNTER — Ambulatory Visit (INDEPENDENT_AMBULATORY_CARE_PROVIDER_SITE_OTHER): Payer: 59 | Admitting: Gastroenterology

## 2017-12-24 ENCOUNTER — Encounter: Payer: Self-pay | Admitting: Gastroenterology

## 2017-12-24 VITALS — BP 80/58 | HR 96 | Ht 63.78 in | Wt 110.0 lb

## 2017-12-24 DIAGNOSIS — R140 Abdominal distension (gaseous): Secondary | ICD-10-CM | POA: Insufficient documentation

## 2017-12-24 DIAGNOSIS — R1012 Left upper quadrant pain: Secondary | ICD-10-CM | POA: Diagnosis not present

## 2017-12-24 HISTORY — DX: Left upper quadrant pain: R10.12

## 2017-12-24 NOTE — Progress Notes (Signed)
12/24/2017 Kelly Macdonald [id number] 06-Aug-1981   HISTORY OF PRESENT ILLNESS: This is a 37 year old Asian female from [name] who presents to our office today complaining of abdominal pain.  Referred by Kelly GaudierAshley Corbett, NP.  She tells me that this pain has been on and off for years, probably for 4 years or so.  She complains of generalized pain and a lot of gas, but also reports left upper quadrant abdominal pain.  Reports heartburn and burning pain, but no really reflux or regurgitation.  She reports feeling constipated, but takes fiber and has a bowel movement every 2 days with that.  She denies seeing blood in her stools.  Says that her pain has worsened.  She reports that she had a colonoscopy 2 or 3 years ago in [name] that was reportedly normal.  She had a CT scan of the abdomen and pelvis with contrast here in February that did not show any specific cause for her abdominal pain.  There was an upper normal amount of stool in the right colon but much of the colon was relatively collapsed.  She is not currently on any medication for her symptoms, but had previously been on omeprazole, Zantac, Bentyl, Zofran, and Gas-X at some point.  She does not recall well, but thinks that the Bentyl seemed to help to some degree.  She recently had Hemoccult negative stools x3.  Recent CBC, BMP, hepatic function panel, amylase were normal.   Past Medical History:  Diagnosis Date  . Anemia    Past Surgical History:  Procedure Laterality Date  . CESAREAN SECTION    . COLONOSCOPY  2017   In [name]    reports that she has never smoked. She has never used smokeless tobacco. She reports that she drinks about 1.2 oz of alcohol per week. She reports that she does not use drugs. family history includes Diabetes in her mother. No Known Allergies    Outpatient Encounter Medications as of 12/24/2017  Medication Sig  . Biotin 1000 MCG tablet Take 2,000 mcg by mouth 3 (three) times daily.  .  Cholecalciferol (VITAMIN D) 2000 units CAPS Take by mouth.  . [DISCONTINUED] dicyclomine (BENTYL) 10 MG capsule Take 1 capsule (10 mg total) by mouth 4 (four) times daily as needed for spasms.  . [DISCONTINUED] omeprazole (PRILOSEC) 40 MG capsule Take 1 capsule every morning for Acid Reflux  . [DISCONTINUED] ondansetron (ZOFRAN) 4 MG tablet Take 1 tablet (4 mg total) by mouth every 6 (six) hours. (Patient not taking: Reported on 09/25/2017)  . [DISCONTINUED] ranitidine (ZANTAC) 150 MG tablet Take 1 tablet (150 mg total) by mouth 2 (two) times daily. (Patient not taking: Reported on 09/25/2017)  . [DISCONTINUED] simethicone (GAS-X) 80 MG chewable tablet Chew 1 tablet (80 mg total) by mouth every 6 (six) hours as needed for flatulence.   No facility-administered encounter medications on file as of 12/24/2017.      REVIEW OF SYSTEMS  : All other systems reviewed and negative except where noted in the History of Present Illness.   PHYSICAL EXAM: BP (!) 80/58   Pulse 96   Ht 5' 3.78" (1.62 m)   Wt 110 lb (49.9 kg)   BMI 19.01 kg/m  General: Well developed Asian female in no acute distress Head: Normocephalic and atraumatic Eyes:  Sclerae anicteric, conjunctiva pink. Ears: Normal auditory acuity Lungs: Clear throughout to auscultation; no increased WOB. Heart: Regular rate and rhythm; no M/R/G. Abdomen: Soft, non-distended.  BS present.  Non-tender. Musculoskeletal:  Symmetrical with no gross deformities  Skin: No lesions on visible extremities Extremities: No edema  Neurological: Alert oriented x 4, grossly non-focal Psychological:  Alert and cooperative. Normal mood and affect  ASSESSMENT AND PLAN: *37 year old Asian female with complaints of abdominal pain and bloating/gas.  Difficult historian.  Has generalized pain, which may be gas related or intestinal spasm, but complains of LUQ abdominal pain as well.  CT scan unremarkable.  Reports of colonoscopy 2-3 years ago in [place]  unremarkable.  Will plan for EGD to rule out gastric ulcer/Hpylori; will schedule with Dr. [name].  Consider trying Bentyl again in the future as this seemed to help some when she was taking it previously.  **The risks, benefits, and alternatives to EGD were discussed with the patient and she consents to proceed.   CC:  Kelly Gaudier, NP

## 2017-12-24 NOTE — Progress Notes (Signed)
Assessment and plans reviewed  

## 2017-12-24 NOTE — Patient Instructions (Signed)

## 2017-12-26 ENCOUNTER — Telehealth: Payer: Self-pay | Admitting: Internal Medicine

## 2017-12-31 NOTE — Telephone Encounter (Signed)
Resent new endo instructions with new date

## 2018-02-04 ENCOUNTER — Encounter: Payer: 59 | Admitting: Internal Medicine

## 2018-02-19 ENCOUNTER — Encounter: Payer: Self-pay | Admitting: Internal Medicine

## 2018-03-02 ENCOUNTER — Encounter: Payer: 59 | Admitting: Internal Medicine

## 2018-07-17 ENCOUNTER — Other Ambulatory Visit: Payer: Self-pay | Admitting: Physician Assistant

## 2018-07-17 MED ORDER — HYOSCYAMINE SULFATE SL 0.125 MG SL SUBL: 0.1250 mg | SUBLINGUAL_TABLET | SUBLINGUAL | 0 refills | Status: DC | PRN

## 2018-08-12 ENCOUNTER — Ambulatory Visit: Payer: Self-pay | Admitting: Adult Health

## 2019-04-14 ENCOUNTER — Encounter: Payer: Self-pay | Admitting: Adult Health

## 2019-04-15 ENCOUNTER — Encounter: Payer: Self-pay | Admitting: Adult Health

## 2020-02-24 ENCOUNTER — Ambulatory Visit (INDEPENDENT_AMBULATORY_CARE_PROVIDER_SITE_OTHER): Payer: 59 | Admitting: Adult Health Nurse Practitioner

## 2020-02-24 ENCOUNTER — Encounter: Payer: Self-pay | Admitting: Adult Health Nurse Practitioner

## 2020-02-24 ENCOUNTER — Other Ambulatory Visit: Payer: Self-pay

## 2020-02-24 VITALS — BP 120/68 | HR 105 | Temp 97.5°F | Wt 114.0 lb

## 2020-02-24 DIAGNOSIS — Z1389 Encounter for screening for other disorder: Secondary | ICD-10-CM

## 2020-02-24 DIAGNOSIS — D509 Iron deficiency anemia, unspecified: Secondary | ICD-10-CM

## 2020-02-24 DIAGNOSIS — Z79899 Other long term (current) drug therapy: Secondary | ICD-10-CM

## 2020-02-24 DIAGNOSIS — Z131 Encounter for screening for diabetes mellitus: Secondary | ICD-10-CM

## 2020-02-24 DIAGNOSIS — R5381 Other malaise: Secondary | ICD-10-CM

## 2020-02-24 DIAGNOSIS — Z1321 Encounter for screening for nutritional disorder: Secondary | ICD-10-CM

## 2020-02-24 DIAGNOSIS — K5909 Other constipation: Secondary | ICD-10-CM

## 2020-02-24 DIAGNOSIS — Z1329 Encounter for screening for other suspected endocrine disorder: Secondary | ICD-10-CM

## 2020-02-24 DIAGNOSIS — Z0000 Encounter for general adult medical examination without abnormal findings: Secondary | ICD-10-CM | POA: Diagnosis not present

## 2020-02-24 DIAGNOSIS — R140 Abdominal distension (gaseous): Secondary | ICD-10-CM

## 2020-02-24 DIAGNOSIS — Z681 Body mass index (BMI) 19 or less, adult: Secondary | ICD-10-CM

## 2020-02-24 DIAGNOSIS — Z13220 Encounter for screening for lipoid disorders: Secondary | ICD-10-CM

## 2020-02-24 DIAGNOSIS — R1012 Left upper quadrant pain: Secondary | ICD-10-CM

## 2020-02-24 NOTE — Progress Notes (Signed)
Assessment and Plan:  Ghazal was seen today for physical Diagnoses and all orders for this visit:  Encounter for annual physical exam -     CBC with Differential/Platelet -     COMPLETE METABOLIC PANEL WITH GFR -     Urinalysis w microscopic + reflex cultur  LUQ abdominal pain Bloating Other constipation Discussed increasing water intake Discussed increasing vegetables in diet Start metamucil daily in morning. Increase water intake  Iron deficiency anemia, unspecified iron deficiency anemia type Not taking iron supplements at this time. -     Iron,Total/Total Iron Binding Cap  Malaise and fatigue Related to anemia vs tyroid,  Thyroid disorder screening -     TSH  Encounter for vitamin deficiency screening -     VITAMIN D 25 Hydroxy (Vit-D Deficiency, Fractures) -     Vitamin B12 -     Magnesium  Screening for diabetes mellitus (DM) -     Hemoglobin A1c  Screening cholesterol level -     Lipid panel  Body mass index (BMI) 19.9 or less, adult Discussed dietary and exercise modifications  Medication management Continued  Close follow up regarding constipation and discussed dietary / lifestyle changes. Contact office with any new or worsening symptoms  Further disposition pending results of labs. Discussed med's effects and SE's.   Over 30 minutes of face to face interview,  exam, counseling, chart review, and critical decision making was performed.   Future Appointments  Date Time Provider [place]  03/09/2020 10:30 AM Garnet Sierras, NP [facility] None    ------------------------------------------------------------------------------------------------------------------   HPI 39 y.o.female presents for evaluation of fatigue that has been going on for 3-65months and now.  She reports that she has trouble going to sleep although she is very tired.  Then when she does go to sleep she ends up waking up after a couple hours and takes her 1-2 hours to go back  to sleep.  She has also noticed hair loss that has been gradual over the course of the past year but has been in increasing amounts. A few months ago she reports that her and her husband have divorced and she has moved out.  She is working part time at [employer] few days a way.  She is from [place] every year to visit her family.  She report two years ago she had a colonoscopy there and there was an infection that she took antibiotics for two weeks.  She reports that she is having constipation as well.  She having bowel movements maybe 3-4 days.  She reports type 1 with straining.  She also has abdominal pains.  She reports that they are intermittent.   She reports drinking 2 bottles of water a day.  She reports that drinking makes her feel really full.   She reports in the morning she has coffee and bread Lunch:  Stir-fry white rice with protein. Dinner:  Similar foods and reports she does not eat many vegetables in her diet. She reports that she exercise three times a week for at least 15min.  She typically walks her dog everyday as well.  She reports that she is not taking any vitamins routinly but used.  Reports this week she picked up Vitamin D5,000IU and has been taking for two days.  She has been taking Keritin to help her hair for the past 6 months.  She does not think this is helping.     Past Medical History:  Diagnosis Date  . Anemia  No Known Allergies  No current outpatient medications on file prior to visit.   No current facility-administered medications on file prior to visit.    ROS: all negative except above.   Physical Exam:  BP 120/68   Pulse (!) 105   Temp (!) 97.5 F (36.4 C)   Wt 114 lb (51.7 kg)   LMP 01/25/2020   SpO2 99%   BMI 19.70 kg/m   General Appearance: Well nourished, in no apparent distress. Eyes: PERRLA, EOMs, conjunctiva no swelling or erythema Sinuses: No Frontal/maxillary tenderness ENT/Mouth: Ext aud canals clear, TMs without  erythema, bulging. No erythema, swelling, or exudate on post pharynx.  Tonsils not swollen or erythematous. Hearing normal.  Neck: Supple, thyroid normal.  Respiratory: Respiratory effort normal, BS equal bilaterally without rales, rhonchi, wheezing or stridor.  Cardio: RRR with no MRGs. Brisk peripheral pulses without edema.  Abdomen: Soft, + BS.  Non tender, no guarding, rebound, hernias, masses. Lymphatics: Non tender without lymphadenopathy.  Musculoskeletal: Full ROM, 5/5 strength, normal gait.  Skin: Warm, dry without rashes, lesions, ecchymosis.  Neuro: Cranial nerves intact. Normal muscle tone, no cerebellar symptoms. Sensation intact.  Psych: Awake and oriented X 3, normal affect, Insight and Judgment appropriate.     Elder Negus, NP 10:38 AM [facility]

## 2020-02-24 NOTE — Patient Instructions (Addendum)
We will follow up in two weeks for your women's health, cervical cancer screening and also to see how you are doing with the suggestions below.   Try Metamucil once a day in the morning.  You can get this any any pharmacy.  Check the ingredients in the product you are using from home.       We are going to check labs today and we  Will call you with the results in 1-3 days.    Increase the amount of water you drink, to 4 bottles of water.  Increase the amount of vegetables in your diet.

## 2020-02-25 ENCOUNTER — Other Ambulatory Visit: Payer: Self-pay | Admitting: Adult Health Nurse Practitioner

## 2020-02-25 DIAGNOSIS — E559 Vitamin D deficiency, unspecified: Secondary | ICD-10-CM

## 2020-02-25 MED ORDER — CHOLECALCIFEROL 1.25 MG (50000 UT) PO CAPS: ORAL_CAPSULE | ORAL | 0 refills | Status: DC

## 2020-03-01 LAB — HEMOGLOBINOPATHY EVALUATION
Fetal Hemoglobin Testing: <1 % (ref 0.0–1.9)
HCT: 42.7 % (ref 35.0–45.0)
Hemoglobin A2 - HGBRFX: 2.9 % (ref 2.2–3.2)
Hemoglobin: 13.3 g/dL (ref 11.7–15.5)
Hgb A: 70.6 % — ABNORMAL LOW (ref >96.0)
Hgb E Quant: 26.5 % — ABNORMAL HIGH
MCH: 24.2 pg — ABNORMAL LOW (ref 27.0–33.0)
MCV: 77.6 fL — ABNORMAL LOW (ref 80.0–100.0)
RBC: 5.5 10*6/uL — ABNORMAL HIGH (ref 3.80–5.10)
RDW: 13.6 % (ref 11.0–15.0)

## 2020-03-01 LAB — VITAMIN B12: Vitamin B-12: 363 pg/mL (ref 200–1100)

## 2020-03-01 LAB — COMPLETE METABOLIC PANEL WITH GFR
AG Ratio: 1.5 (calc) (ref 1.0–2.5)
ALT: 11 U/L (ref 6–29)
AST: 15 U/L (ref 10–30)
Albumin: 4.4 g/dL (ref 3.6–5.1)
Alkaline phosphatase (APISO): 34 U/L (ref 31–125)
BUN: 14 mg/dL (ref 7–25)
CO2: 32 mmol/L (ref 20–32)
Calcium: 9.6 mg/dL (ref 8.6–10.2)
Chloride: 101 mmol/L (ref 98–110)
Creat: 0.81 mg/dL (ref 0.50–1.10)
GFR, Est African American: 108 mL/min/{1.73_m2} (ref >=60)
GFR, Est Non African American: 93 mL/min/{1.73_m2} (ref >=60)
Globulin: 3 g/dL (calc) (ref 1.9–3.7)
Glucose, Bld: 80 mg/dL (ref 65–99)
Potassium: 4.1 mmol/L (ref 3.5–5.3)
Sodium: 136 mmol/L (ref 135–146)
Total Bilirubin: 0.5 mg/dL (ref 0.2–1.2)
Total Protein: 7.4 g/dL (ref 6.1–8.1)

## 2020-03-01 LAB — CBC WITH DIFFERENTIAL/PLATELET
Absolute Monocytes: 391 cells/uL (ref 200–950)
Basophils Absolute: 32 cells/uL (ref 0–200)
Basophils Relative: 0.5 %
Eosinophils Absolute: 69 cells/uL (ref 15–500)
Eosinophils Relative: 1.1 %
HCT: 41.6 % (ref 35.0–45.0)
Hemoglobin: 13 g/dL (ref 11.7–15.5)
Lymphs Abs: 1468 cells/uL (ref 850–3900)
MCH: 23.9 pg — ABNORMAL LOW (ref 27.0–33.0)
MCHC: 31.3 g/dL — ABNORMAL LOW (ref 32.0–36.0)
MCV: 76.3 fL — ABNORMAL LOW (ref 80.0–100.0)
MPV: 9.4 fL (ref 7.5–12.5)
Monocytes Relative: 6.2 %
Neutro Abs: 4341 cells/uL (ref 1500–7800)
Neutrophils Relative %: 68.9 %
Platelets: 348 10*3/uL (ref 140–400)
RBC: 5.45 10*6/uL — ABNORMAL HIGH (ref 3.80–5.10)
RDW: 13.6 % (ref 11.0–15.0)
Total Lymphocyte: 23.3 %
WBC: 6.3 10*3/uL (ref 3.8–10.8)

## 2020-03-01 LAB — URINALYSIS W MICROSCOPIC + REFLEX CULTURE
Bacteria, UA: NONE SEEN /HPF
Bilirubin Urine: NEGATIVE
Glucose, UA: NEGATIVE
Hyaline Cast: NONE SEEN /LPF
Ketones, ur: NEGATIVE
Leukocyte Esterase: NEGATIVE
Nitrites, Initial: NEGATIVE
Protein, ur: NEGATIVE
RBC / HPF: NONE SEEN /HPF (ref 0–2)
Specific Gravity, Urine: 1.012 (ref 1.001–1.03)
Squamous Epithelial / HPF: NONE SEEN /HPF (ref <=5)
WBC, UA: NONE SEEN /HPF (ref 0–5)
pH: 7.5 (ref 5.0–8.0)

## 2020-03-01 LAB — LIPID PANEL
Cholesterol: 168 mg/dL (ref <200)
HDL: 78 mg/dL (ref >=50)
LDL Cholesterol (Calc): 62 mg/dL (calc)
Non-HDL Cholesterol (Calc): 90 mg/dL (calc) (ref <130)
Total CHOL/HDL Ratio: 2.2 (calc) (ref <5.0)
Triglycerides: 215 mg/dL — ABNORMAL HIGH (ref <150)

## 2020-03-01 LAB — HEMOGLOBIN A1C
Hgb A1c MFr Bld: 5.2 % of total Hgb (ref <5.7)
Mean Plasma Glucose: 103 (calc)
eAG (mmol/L): 5.7 (calc)

## 2020-03-01 LAB — TEST AUTHORIZATION

## 2020-03-01 LAB — IRON, TOTAL/TOTAL IRON BINDING CAP
%SAT: 59 % (calc) — ABNORMAL HIGH (ref 16–45)
Iron: 199 ug/dL — ABNORMAL HIGH (ref 40–190)
TIBC: 339 mcg/dL (calc) (ref 250–450)

## 2020-03-01 LAB — FOLATE: Folate: 12.6 ng/mL

## 2020-03-01 LAB — VITAMIN D 25 HYDROXY (VIT D DEFICIENCY, FRACTURES): Vit D, 25-Hydroxy: 22 ng/mL — ABNORMAL LOW (ref 30–100)

## 2020-03-01 LAB — NO CULTURE INDICATED

## 2020-03-01 LAB — TSH: TSH: 1.72 mIU/L

## 2020-03-01 LAB — FERRITIN: Ferritin: 49 ng/mL (ref 16–154)

## 2020-03-01 LAB — MAGNESIUM: Magnesium: 2.4 mg/dL (ref 1.5–2.5)

## 2020-03-09 ENCOUNTER — Ambulatory Visit: Payer: 59 | Admitting: Adult Health Nurse Practitioner

## 2020-03-13 NOTE — Progress Notes (Deleted)
Assessment and Plan:  Kelly Macdonald was seen today for physical Diagnoses and all orders for this visit:  Encounter for annual physical exam -     CBC with Differential/Platelet -     COMPLETE METABOLIC PANEL WITH GFR -     Urinalysis w microscopic + reflex cultur  LUQ abdominal pain Bloating Other constipation Discussed increasing water intake Discussed increasing vegetables in diet Start metamucil daily in morning. Increase water intake  Iron deficiency anemia, unspecified iron deficiency anemia type Not taking iron supplements at this time. -     Iron,Total/Total Iron Binding Cap  Malaise and fatigue Related to anemia vs tyroid,  Thyroid disorder screening -     TSH  Encounter for vitamin deficiency screening -     VITAMIN D 25 Hydroxy (Vit-D Deficiency, Fractures) -     Vitamin B12 -     Magnesium  Screening for diabetes mellitus (DM) -     Hemoglobin A1c  Screening cholesterol level -     Lipid panel  Body mass index (BMI) 19.9 or less, adult Discussed dietary and exercise modifications  Medication management Continued  Close follow up regarding constipation and discussed dietary / lifestyle changes. Contact office with any new or worsening symptoms  Further disposition pending results of labs. Discussed med's effects and SE's.   Over 30 minutes of face to face interview,  exam, counseling, chart review, and critical decision making was performed.   Future Appointments  Date Time Provider Department Center  03/16/2020 11:30 AM Serenna Deroy, Bella Kennedy, NP [facility] None    ------------------------------------------------------------------------------------------------------------------   HPI 39 y.o.female presents for evaluation of fatigue that has been going on for 3-56months and now.  She reports that she has trouble going to sleep although she is very tired.  Then when she does go to sleep she ends up waking up after a couple hours and takes her 1-2 hours to go  back to sleep.  She has also noticed hair loss that has been gradual over the course of the past year but has been in increasing amounts. A few months ago she reports that her and her husband have divorced and she has moved out.  She is working part time at [employer] few days a way.  She is from [place] every year to visit her family.  She report two years ago she had a colonoscopy there and there was an infection that she took antibiotics for two weeks.  She reports that she is having constipation as well.  She having bowel movements maybe 3-4 days.  She reports type 1 with straining.  She also has abdominal pains.  She reports that they are intermittent.   She reports drinking 2 bottles of water a day.  She reports that drinking makes her feel really full.   She reports in the morning she has coffee and bread Lunch:  Stir-fry white rice with protein. Dinner:  Similar foods and reports she does not eat many vegetables in her diet. She reports that she exercise three times a week for at least .  She typically walks her dog everyday as well.  She reports that she is not taking any vitamins routinly but used.  Reports this week she picked up Vitamin D5,000IU and has been taking for two days.  She has been taking Keritin to help her hair for the past 6 months.  She does not think this is helping.     Past Medical History:  Diagnosis Date  . Anemia  No Known Allergies  Current Outpatient Medications on File Prior to Visit  Medication Sig  . Cholecalciferol 1.25 MG (50000 UT) capsule Take one tablet by mouth three days a week for twelve weeks.   No current facility-administered medications on file prior to visit.    ROS: all negative except above.   Physical Exam:  There were no vitals taken for this visit.  General Appearance: Well nourished, in no apparent distress. Eyes: PERRLA, EOMs, conjunctiva no swelling or erythema Sinuses: No Frontal/maxillary  tenderness ENT/Mouth: Ext aud canals clear, TMs without erythema, bulging. No erythema, swelling, or exudate on post pharynx.  Tonsils not swollen or erythematous. Hearing normal.  Neck: Supple, thyroid normal.  Respiratory: Respiratory effort normal, BS equal bilaterally without rales, rhonchi, wheezing or stridor.  Cardio: RRR with no MRGs. Brisk peripheral pulses without edema.  Abdomen: Soft, + BS.  Non tender, no guarding, rebound, hernias, masses. Lymphatics: Non tender without lymphadenopathy.  Musculoskeletal: Full ROM, 5/5 strength, normal gait.  Skin: Warm, dry without rashes, lesions, ecchymosis.  Neuro: Cranial nerves intact. Normal muscle tone, no cerebellar symptoms. Sensation intact.  Psych: Awake and oriented X 3, normal affect, Insight and Judgment appropriate.     Elder Negus, NP 10:38 AM [facility]

## 2020-03-16 ENCOUNTER — Ambulatory Visit: Payer: 59 | Admitting: Adult Health Nurse Practitioner

## 2020-03-20 NOTE — Progress Notes (Deleted)
FOLLOW UP  Assessment and Plan:  Kelly Macdonald was seen today for physical Diagnoses and all orders for this visit:  LUQ abdominal pain Bloating Other constipation Discussed increasing water intake Discussed increasing vegetables in diet Start metamucil daily in morning. Increase water intake  Encounter for cervical cancer screening   Iron deficiency anemia, unspecified iron deficiency anemia type Not taking iron supplements at this time. -     Iron,Total/Total Iron Binding Cap  Malaise and fatigue Related to anemia vs tyroid,   Body mass index (BMI) 19.9 or less, adult Discussed dietary and exercise modifications  Medication management Continued  Close follow up regarding constipation and discussed dietary / lifestyle changes. Contact office with any new or worsening symptoms  Further disposition pending results of labs. Discussed med's effects and SE's.   Over 30 minutes of face to face interview,  exam, counseling, chart review, and critical decision making was performed.   Future Appointments  Date Time Provider Department Center  03/21/2020  4:00 PM Elder Negus, NP [facility] None    ------------------------------------------------------------------------------------------------------------------   HPI 39 y.o.female presents for evaluation of fatigue that has been going on for 3-28months and now.  She reports that she has trouble going to sleep although she is very tired.  Then when she does go to sleep she ends up waking up after a couple hours and takes her 1-2 hours to go back to sleep.  She has also noticed hair loss that has been gradual over the course of the past year but has been in increasing amounts. A few months ago she reports that her and her husband have divorced and she has moved out.  She is working part time at [employer] few days a way.  She is from [place] every year to visit her family.  She report two years ago she had a colonoscopy there and  there was an infection that she took antibiotics for two weeks.  She reports that she is having constipation as well.  She having bowel movements maybe 3-4 days.  She reports type 1 with straining.  She also has abdominal pains.  She reports that they are intermittent.   She reports drinking 2 bottles of water a day.  She reports that drinking makes her feel really full.   She reports in the morning she has coffee and bread Lunch:  Stir-fry white rice with protein. Dinner:  Similar foods and reports she does not eat many vegetables in her diet. She reports that she exercise three times a week for at least .  She typically walks her dog everyday as well.  She reports that she is not taking any vitamins routinly but used.  Reports this week she picked up Vitamin D5,000IU and has been taking for two days.  She has been taking Keritin to help her hair for the past 6 months.  She does not think this is helping.     Past Medical History:  Diagnosis Date   Anemia      No Known Allergies  Current Outpatient Medications on File Prior to Visit  Medication Sig   Cholecalciferol 1.25 MG (50000 UT) capsule Take one tablet by mouth three days a week for twelve weeks.   No current facility-administered medications on file prior to visit.    ROS: all negative except above.   Physical Exam:  There were no vitals taken for this visit.  General Appearance: Well nourished, in no apparent distress. Eyes: PERRLA, EOMs, conjunctiva no swelling or  erythema Sinuses: No Frontal/maxillary tenderness ENT/Mouth: Ext aud canals clear, TMs without erythema, bulging. No erythema, swelling, or exudate on post pharynx.  Tonsils not swollen or erythematous. Hearing normal.  Neck: Supple, thyroid normal.  Respiratory: Respiratory effort normal, BS equal bilaterally without rales, rhonchi, wheezing or stridor.  Cardio: RRR with no MRGs. Brisk peripheral pulses without edema.  Abdomen: Soft, + BS.  Non  tender, no guarding, rebound, hernias, masses. Lymphatics: Non tender without lymphadenopathy.  Musculoskeletal: Full ROM, 5/5 strength, normal gait.  Skin: Warm, dry without rashes, lesions, ecchymosis.  Neuro: Cranial nerves intact. Normal muscle tone, no cerebellar symptoms. Sensation intact.  Psych: Awake and oriented X 3, normal affect, Insight and Judgment appropriate.   Pelvic exam:  normal external genitalia, vulva, no erythema or exudate.  Internal exam: Vaginal mucosa pink, no gross lesions, white discharge noted, cervix, no lesions or exudate no.  Ovaries palpated bilaterally, no cervical motion tenderness noted.  Sample obtained via broom and patient tolerated well.    Elder Negus, NP 10:38 AM [facility]

## 2020-03-21 ENCOUNTER — Ambulatory Visit: Payer: 59 | Admitting: Adult Health Nurse Practitioner

## 2021-08-03 ENCOUNTER — Other Ambulatory Visit: Payer: Self-pay

## 2021-08-03 ENCOUNTER — Ambulatory Visit
Admission: EM | Admit: 2021-08-03 | Discharge: 2021-08-03 | Disposition: A | Discharge status: Home / Self Care | Payer: 59

## 2021-08-03 NOTE — ED Triage Notes (Addendum)
Pt made aware that she would possibly have to see an ENT specialist to have suspected suture removed but we would be happy to assess her here. Pt decided to go.

## 2021-08-08 ENCOUNTER — Emergency Department (HOSPITAL_BASED_OUTPATIENT_CLINIC_OR_DEPARTMENT_OTHER)
Admission: EM | Admit: 2021-08-08 | Discharge: 2021-08-08 | Disposition: A | Discharge status: Home / Self Care | Payer: Self-pay | Attending: Emergency Medicine | Admitting: Emergency Medicine

## 2021-08-08 ENCOUNTER — Other Ambulatory Visit: Payer: Self-pay

## 2021-08-08 ENCOUNTER — Encounter (HOSPITAL_BASED_OUTPATIENT_CLINIC_OR_DEPARTMENT_OTHER): Payer: Self-pay | Admitting: Emergency Medicine

## 2021-08-08 DIAGNOSIS — Z4802 Encounter for removal of sutures: Secondary | ICD-10-CM | POA: Insufficient documentation

## 2021-08-08 DIAGNOSIS — S0121XD Laceration without foreign body of nose, subsequent encounter: Secondary | ICD-10-CM | POA: Insufficient documentation

## 2021-08-08 DIAGNOSIS — X58XXXD Exposure to other specified factors, subsequent encounter: Secondary | ICD-10-CM | POA: Insufficient documentation

## 2021-08-08 NOTE — ED Provider Notes (Signed)
[facility] EMERGENCY DEPT Provider Note   CSN: [id number] Arrival date & time: 08/08/21  1200     History Chief Complaint  Patient presents with   Suture / Staple Removal    Kelly Macdonald is a 40 y.o. female.  40 year old female presents today for evaluation of suture removal.  Patient had Mohs surgery done in [place] 2 months ago and feels there is 1 suture still remaining.  She reports they put dissolvable sutures during the surgery.  She reports the rest have resolved.  She denies any fever, chills, or epistaxis.  She reports occasional irritation from the residual suture.  The history is provided by the patient. No language interpreter was used.      Past Medical History:  Diagnosis Date   Anemia     Patient Active Problem List   Diagnosis Date Noted   LUQ abdominal pain 12/24/2017   Bloating 12/24/2017    Past Surgical History:  Procedure Laterality Date   CESAREAN SECTION     COLONOSCOPY  2017   In [place]     OB History   No obstetric history on file.     Family History  Problem Relation Age of Onset   Diabetes Mother    Colon cancer Neg Hx    Liver cancer Neg Hx     Social History   Tobacco Use   Smoking status: Never   Smokeless tobacco: Never  Vaping Use   Vaping Use: Never used  Substance Use Topics   Alcohol use: Yes    Alcohol/week: 2.0 standard drinks    Types: 2 Glasses of wine per week    Comment: two times weekly   Drug use: No    Home Medications Prior to Admission medications   Medication Sig Start Date End Date Taking? Authorizing Provider  Cholecalciferol 1.25 MG (50000 UT) capsule Take one tablet by mouth three days a week for twelve weeks. 02/25/20   Elder Negus, NP    Allergies    Patient has no known allergies.  Review of Systems   Review of Systems  Constitutional:  Negative for chills and fever.  HENT:  Negative for nosebleeds.   All other systems reviewed and are negative.  Physical  Exam Updated Vital Signs BP (!) 144/96 (BP Location: Right Arm)   Pulse 98   Temp 98.4 F (36.9 C)   Resp 12   SpO2 100%   Physical Exam Vitals and nursing note reviewed.  Constitutional:      General: She is not in acute distress.    Appearance: Normal appearance. She is not ill-appearing.  HENT:     Head: Normocephalic and atraumatic.     Nose: Nose normal. No nasal deformity, septal deviation or mucosal edema.     Right Nostril: No epistaxis.     Left Nostril: No epistaxis.     Comments: Left nare with suture present superior medial aspect. Eyes:     Conjunctiva/sclera: Conjunctivae normal.  Pulmonary:     Effort: Pulmonary effort is normal. No respiratory distress.  Musculoskeletal:        General: No deformity.  Skin:    Findings: No rash.  Neurological:     Mental Status: She is alert.    ED Results / Procedures / Treatments   Labs (all labs ordered are listed, but only abnormal results are displayed) Labs Reviewed - No data to display  EKG None  Radiology No results found.  Procedures .Suture Removal  Date/Time: 08/08/2021 1:28  PM Performed by: Marita Kansas, PA-C Authorized by: Marita Kansas, PA-C   Consent:    Consent obtained:  Verbal   Consent given by:  Patient   Risks discussed:  Bleeding Universal protocol:    Procedure explained and questions answered to patient or proxy's satisfaction: yes     Patient identity confirmed:  Verbally with patient and arm band Location:    Location:  Head/neck   Head/neck location:  Nose Procedure details:    Wound appearance:  No signs of infection and clean   Number of sutures removed:  1 Post-procedure details:    Procedure completion:  Tolerated well, no immediate complications   Medications Ordered in ED Medications - No data to display  ED Course  I have reviewed the triage vital signs and the nursing notes.  Pertinent labs & imaging results that were available during my care of the patient were  reviewed by me and considered in my medical decision making (see chart for details).    MDM Rules/Calculators/A&P                           40 year old female presents for suture removal.  Suture was placed 2 months ago in [place] while she had nose surgery.  Suture removed.  Final Clinical Impression(s) / ED Diagnoses Final diagnoses:  None    Rx / DC Orders ED Discharge Orders     None        Marita Kansas, PA-C 08/08/21 1329    Melene Plan, DO 08/08/21 1521

## 2021-08-08 NOTE — ED Triage Notes (Signed)
States has a suture in her left nare that has been there a 2 months , in [place], went to [facility] did not stay wait was to long, pt was told suture would dissolve

## 2021-08-08 NOTE — ED Notes (Signed)
Attempting to dc x3, registration in chart

## 2021-08-08 NOTE — Discharge Instructions (Signed)
He had a suture removed today.  He may have minimal bleeding apply direct pressure.  If you have significant bleeding that is uncontrolled with direct pressure please return to the emergency room.

## 2024-03-11 ENCOUNTER — Other Ambulatory Visit (HOSPITAL_BASED_OUTPATIENT_CLINIC_OR_DEPARTMENT_OTHER): Payer: Self-pay

## 2024-03-11 ENCOUNTER — Other Ambulatory Visit: Payer: Self-pay

## 2024-03-11 ENCOUNTER — Emergency Department (HOSPITAL_BASED_OUTPATIENT_CLINIC_OR_DEPARTMENT_OTHER)
Admission: EM | Admit: 2024-03-11 | Discharge: 2024-03-11 | Disposition: A | Discharge status: Home / Self Care | Source: Ambulatory Visit | Attending: Emergency Medicine | Admitting: Emergency Medicine

## 2024-03-11 DIAGNOSIS — M5020 Other cervical disc displacement, unspecified cervical region: Secondary | ICD-10-CM | POA: Diagnosis not present

## 2024-03-11 DIAGNOSIS — M542 Cervicalgia: Secondary | ICD-10-CM | POA: Diagnosis present

## 2024-03-11 DIAGNOSIS — M5000 Cervical disc disorder with myelopathy, unspecified cervical region: Secondary | ICD-10-CM | POA: Insufficient documentation

## 2024-03-11 LAB — CBG MONITORING, ED: Glucose-Capillary: 101 mg/dL — ABNORMAL HIGH (ref 70–99)

## 2024-03-11 MED ORDER — METHYLPREDNISOLONE 4 MG PO TBPK
ORAL_TABLET | ORAL | 0 refills | Status: DC
  Filled 2024-03-11: qty 21, 6d supply, fill #0

## 2024-03-11 NOTE — ED Provider Notes (Signed)
 [place] EMERGENCY DEPARTMENT AT [facility] Provider Note   CSN: [id number] Arrival date & time: 03/11/24  1446     Patient presents with: No chief complaint on file.   Kelly Macdonald is a 43 y.o. female.   HPI Patient had problems with neck pain for about 5 months.  She denies there was any injury that precipitated.  She experiences a sharp and uncomfortable aching pain to the right side of the cervical spine on the right.  Patient reports over the past several months now she is intermittently getting combination of tingling and pain that go into the right arm and the right leg.  This is intermittent.  Sometimes there is numbness.  Patient was [place] and had an [place] done.  Recommendations at that time were to follow-up with a neurologist.  Patient is now permanently back in the [place]  and is determining what follow-up she needs.  She denies she has had any specific treatment for this in the past.  She has not been tried on any steroid medications.           Prior to Admission medications   Medication Sig Start Date End Date Taking? Authorizing Provider  methylPREDNISolone  (MEDROL  DOSEPAK) 4 MG TBPK tablet TAKE AS DIRECTED PER PACKAGE INSTRUCTIONS 03/11/24  Yes Jonnell Hentges, Ludivina, MD  Cholecalciferol  1.25 MG (50000 UT) capsule Take one tablet by mouth three days a week for twelve weeks. 02/25/20   McClanahan, Kyra, NP    Allergies: Patient has no known allergies.    Review of Systems  Updated Vital Signs BP (!) 140/85 (BP Location: Left Arm)   Pulse 92   Temp 98 F (36.7 C)   Resp 17   SpO2 100%   Physical Exam Constitutional:      Comments: Patient is alert nontoxic.  Clinically well in appearance.  Well-nourished well-developed.  HENT:     Mouth/Throat:     Pharynx: Oropharynx is clear.  Eyes:     Extraocular Movements: Extraocular movements intact.  Neck:     Comments: No midline C-spine tenderness.  Patient does endorse discomfort to palpation  at about the seat for 5 paraspinous ligamentous and muscle bodies.  This stops in the neck and does not extend into the trapezius.  Patient does have intact range of motion can perform flexion extension and turning of the head without significant exacerbation of pain. Cardiovascular:     Rate and Rhythm: Normal rate and regular rhythm.  Pulmonary:     Effort: Pulmonary effort is normal.     Breath sounds: Normal breath sounds.  Abdominal:     General: There is no distension.     Palpations: Abdomen is soft.     Tenderness: There is no abdominal tenderness.  Musculoskeletal:        General: No swelling or tenderness. Normal range of motion.     Right lower leg: No edema.     Left lower leg: No edema.     Comments: No lower extremity edema.  Lower extremities are in good condition.  Normal symmetric muscular development.  Skin:    General: Skin is warm and dry.  Neurological:     Comments: Alert with normal mental status normal speech.  Motor testing 4 strength in grip\push and pull of upper extremities 5\5.  Sensation intact to light touch.  Biceps reflexes 1+ symmetric.  Lower extremity strength 5\5 against resistance.  Patellar reflexes 2+ symmetric.  Psychiatric:  Mood and Affect: Mood normal.     (all labs ordered are listed, but only abnormal results are displayed) Labs Reviewed  CBG MONITORING, ED - Abnormal; Notable for the following components:      Result Value   Glucose-Capillary 101 (*)    All other components within normal limits    EKG: None  Radiology: No results found.   Procedures   Medications Ordered in the ED - No data to display                                  Medical Decision Making Risk Prescription drug management.   Patient presents as outlined.  She has had at least 5 months of cervical pain followed by episodes of numbness and tingling on the right.  Also reviewed report from MRI and evaluation in [place] shows perhaps 1 to 2  years of chronic pain and MRI results with mild disc bulge but no critical herniations or impingements.  At this time, patient's neuromotor exam is intact.  Patient is otherwise well in appearance.  She denies ever having had a course of steroid therapy.  Will prescribe Medrol  Dosepak and recommend follow-up as soon as possible with [facility].  Also recommend patient getting established with PCP.  Patient and companion at bedside voiced understanding.     Final diagnoses:  Intervertebral cervical disc disorder with myelopathy, cervical region    ED Discharge Orders          Ordered    methylPREDNISolone  (MEDROL  DOSEPAK) 4 MG TBPK tablet        03/11/24 1600               Armenta Canning, MD 03/11/24 1615

## 2024-03-11 NOTE — Discharge Instructions (Signed)
 1.  Start the Medrol  Dosepak tomorrow morning as prescribed.  For extra pain control you may take extra strength Tylenol every 6 hours.  You may also use an over-the-counter pain patch such as a Lidoderm patch and place it on the back of your neck in the area of pain. 2.  Call [facility] and schedule an appointment as soon as possible.  Take your MRI results with you. 3.  Return to the emergency department if you have new or worsening symptoms.

## 2024-03-11 NOTE — ED Notes (Signed)
 RN reviewed discharge instructions with pt. Pt verbalized understanding and had no further questions. VSS upon discharge.

## 2024-03-11 NOTE — ED Triage Notes (Signed)
 For past few months right side neck pain/tingling at times, that  is from her neck/arm and leg. Intermittent numbness, some tingling at times .   Pt returned from [place] on Tuesday. She did have MRI in [place] for these symptoms.rec seeing a neurologist.   No neuro changes noted in triage.

## 2024-04-08 ENCOUNTER — Emergency Department (HOSPITAL_BASED_OUTPATIENT_CLINIC_OR_DEPARTMENT_OTHER)

## 2024-04-08 ENCOUNTER — Emergency Department (HOSPITAL_BASED_OUTPATIENT_CLINIC_OR_DEPARTMENT_OTHER)
Admission: EM | Admit: 2024-04-08 | Discharge: 2024-04-08 | Disposition: A | Discharge status: Home / Self Care | Attending: Emergency Medicine | Admitting: Emergency Medicine

## 2024-04-08 ENCOUNTER — Other Ambulatory Visit: Payer: Self-pay

## 2024-04-08 DIAGNOSIS — M545 Low back pain, unspecified: Secondary | ICD-10-CM | POA: Diagnosis present

## 2024-04-08 DIAGNOSIS — R29818 Other symptoms and signs involving the nervous system: Secondary | ICD-10-CM | POA: Diagnosis not present

## 2024-04-08 DIAGNOSIS — M5127 Other intervertebral disc displacement, lumbosacral region: Secondary | ICD-10-CM | POA: Diagnosis not present

## 2024-04-08 DIAGNOSIS — M5137 Other intervertebral disc degeneration, lumbosacral region with discogenic back pain only: Secondary | ICD-10-CM | POA: Diagnosis not present

## 2024-04-08 DIAGNOSIS — M5412 Radiculopathy, cervical region: Secondary | ICD-10-CM | POA: Diagnosis not present

## 2024-04-08 DIAGNOSIS — M542 Cervicalgia: Secondary | ICD-10-CM | POA: Diagnosis not present

## 2024-04-08 DIAGNOSIS — R519 Headache, unspecified: Secondary | ICD-10-CM | POA: Diagnosis not present

## 2024-04-08 DIAGNOSIS — M4802 Spinal stenosis, cervical region: Secondary | ICD-10-CM | POA: Diagnosis not present

## 2024-04-08 DIAGNOSIS — M5416 Radiculopathy, lumbar region: Secondary | ICD-10-CM | POA: Insufficient documentation

## 2024-04-08 LAB — COMPREHENSIVE METABOLIC PANEL WITH GFR
ALT: 14 U/L (ref 0–44)
AST: 22 U/L (ref 15–41)
Albumin: 4.6 g/dL (ref 3.5–5.0)
Alkaline Phosphatase: 53 U/L (ref 38–126)
Anion gap: 12 (ref 5–15)
BUN: 14 mg/dL (ref 6–20)
CO2: 27 mmol/L (ref 22–32)
Calcium: 10 mg/dL (ref 8.9–10.3)
Chloride: 101 mmol/L (ref 98–111)
Creatinine, Ser: 0.96 mg/dL (ref 0.44–1.00)
GFR, Estimated: >60 mL/min (ref >60)
Glucose, Bld: 115 mg/dL — ABNORMAL HIGH (ref 70–99)
Potassium: 4.1 mmol/L (ref 3.5–5.1)
Sodium: 140 mmol/L (ref 135–145)
Total Bilirubin: 0.2 mg/dL (ref 0.0–1.2)
Total Protein: 7.9 g/dL (ref 6.5–8.1)

## 2024-04-08 LAB — CBC WITH DIFFERENTIAL/PLATELET
Abs Immature Granulocytes: 0.02 K/uL (ref 0.00–0.07)
Basophils Absolute: 0 K/uL (ref 0.0–0.1)
Basophils Relative: 0 %
Eosinophils Absolute: 0.1 K/uL (ref 0.0–0.5)
Eosinophils Relative: 2 %
HCT: 39.4 % (ref 36.0–46.0)
Hemoglobin: 12.9 g/dL (ref 12.0–15.0)
Immature Granulocytes: 0 %
Lymphocytes Relative: 38 %
Lymphs Abs: 2.5 K/uL (ref 0.7–4.0)
MCH: 24.1 pg — ABNORMAL LOW (ref 26.0–34.0)
MCHC: 32.7 g/dL (ref 30.0–36.0)
MCV: 73.6 fL — ABNORMAL LOW (ref 80.0–100.0)
Monocytes Absolute: 0.4 K/uL (ref 0.1–1.0)
Monocytes Relative: 7 %
Neutro Abs: 3.4 K/uL (ref 1.7–7.7)
Neutrophils Relative %: 53 %
Platelets: 337 K/uL (ref 150–400)
RBC: 5.35 MIL/uL — ABNORMAL HIGH (ref 3.87–5.11)
RDW: 13.2 % (ref 11.5–15.5)
WBC: 6.4 K/uL (ref 4.0–10.5)
nRBC: 0 % (ref 0.0–0.2)

## 2024-04-08 NOTE — Discharge Instructions (Signed)
 Keep your appointment with neurosurgery.  Also make an appointment with neurology.  MRI without significant findings.  But there was some findings at C5-C6 and L5-S1.  But not sure they were extensive enough to cause all the symptoms.  No evidence of any significant herniated disc.  But there was some foraminal narrowing.  Return for any new or worse symptoms.  Also would recommend following up with your primary care doctor

## 2024-04-08 NOTE — ED Provider Notes (Addendum)
 [place] EMERGENCY DEPARTMENT AT [facility] Provider Note   CSN: [id number] Arrival date & time: 04/08/24  1713     Patient presents with: Back Pain   Kelly Macdonald is a 43 y.o. female.   Patient with the complaint of bilateral upper extremity pain.  Weakness in the hands complaint of neck pain.  Also complaint of lower back pain and pain radiating down into the right leg.  With some weakness in that right leg.  Patient was seen in the emergency department July 10.  Prior to that she was back in [place].  Early July patient had MRI done there.  It was done for chronic neck body and back pain for about 1 to 2 years.  MRI was done just of the cervical spine.  That showed no definitive evidence of fracture mild right retrolisthesis C3 over C4 and C5 over C6 are seen.  No narrowing visualization in the vertebral disc space is noted no abnormal vertebral soft tissue thickening is found.  Patient has follow-up with [facility] in 2 weeks.  But she feels as if symptoms are getting worse.  Now she is also complaining of a posterior headache.  Denies any fevers.  Temp here 98.3 pulse 81 respiration 16 blood pressure 130/82 oxygen sats 100% on room air.  Patient denies any incontinence.  No involvement in the left lower extremity.  But she has new involvement in both upper extremities.  Patient is [place] the [place]  full[place]time.  Past surgical history is significant for C-section colonoscopy and past medical history significant for anemia.  Patient is never used tobacco products.  Of significance there is no incontinence.  So no concerns for cauda equina.  But there has been a progression of her symptoms into the left arm.  And now with the lower back with radiation into the right leg.  Her notes from [place] to talk about chronic symptoms similar to this for at least a year.  Patient states that it has been at least several weeks.  Patient states that the steroid she was put on  during her July 10 visit did not make any difference.  And things have gotten worse.       Prior to Admission medications   Medication Sig Start Date End Date Taking? Authorizing Provider  Cholecalciferol  1.25 MG (50000 UT) capsule Take one tablet by mouth three days a week for twelve weeks. 02/25/20   McClanahan, Kyra, NP  methylPREDNISolone  (MEDROL  DOSEPAK) 4 MG TBPK tablet TAKE AS DIRECTED PER PACKAGE INSTRUCTIONS 03/11/24   Armenta Canning, MD    Allergies: Patient has no known allergies.    Review of Systems  Constitutional:  Negative for chills and fever.  HENT:  Negative for ear pain and sore throat.   Eyes:  Negative for pain and visual disturbance.  Respiratory:  Negative for cough and shortness of breath.   Cardiovascular:  Negative for chest pain and palpitations.  Gastrointestinal:  Negative for abdominal pain and vomiting.  Genitourinary:  Negative for dysuria and hematuria.  Musculoskeletal:  Positive for back pain and neck pain. Negative for arthralgias.  Skin:  Negative for color change and rash.  Neurological:  Positive for weakness, numbness and headaches. Negative for seizures and syncope.  All other systems reviewed and are negative.   Updated Vital Signs BP 130/82   Pulse 81   Temp 98.3 F (36.8 C) (Oral)   Resp 16   SpO2 100%   Physical Exam Vitals and nursing  note reviewed.  Constitutional:      General: She is not in acute distress.    Appearance: Normal appearance. She is well-developed. She is not ill-appearing.  HENT:     Head: Normocephalic and atraumatic.     Mouth/Throat:     Mouth: Mucous membranes are moist.  Eyes:     Extraocular Movements: Extraocular movements intact.     Conjunctiva/sclera: Conjunctivae normal.     Pupils: Pupils are equal, round, and reactive to light.  Neck:     Comments: Right sided pain. Cardiovascular:     Rate and Rhythm: Normal rate and regular rhythm.     Heart sounds: No murmur heard. Pulmonary:      Effort: Pulmonary effort is normal. No respiratory distress.     Breath sounds: Normal breath sounds.  Abdominal:     Palpations: Abdomen is soft.     Tenderness: There is no abdominal tenderness.  Musculoskeletal:        General: No swelling.     Cervical back: Neck supple. No tenderness.  Skin:    General: Skin is warm and dry.     Capillary Refill: Capillary refill takes less than 2 seconds.  Neurological:     Mental Status: She is alert.     Sensory: Sensory deficit present.     Motor: Weakness present.  Psychiatric:        Mood and Affect: Mood normal.     (all labs ordered are listed, but only abnormal results are displayed) Labs Reviewed  CBC WITH DIFFERENTIAL/PLATELET - Abnormal; Notable for the following components:      Result Value   RBC 5.35 (*)    MCV 73.6 (*)    MCH 24.1 (*)    All other components within normal limits  COMPREHENSIVE METABOLIC PANEL WITH GFR - Abnormal; Notable for the following components:   Glucose, Bld 115 (*)    All other components within normal limits    EKG: None  Radiology: MR Cervical Spine Wo Contrast Result Date: 04/08/2024 CLINICAL DATA:  Myelopathy, chronic, cervical spine; Low back pain, symptoms persist with > 6 wks treatment Lumbar radiculopathy, symptoms persist with > 6 wks treatment; Mid-back pain, neuro deficit Myelopathy, chronic, thoracic spine EXAM: MRI CERVICAL, THORACIC AND LUMBAR SPINE WITHOUT CONTRAST TECHNIQUE: Multiplanar and multiecho pulse sequences of the cervical spine, to include the craniocervical junction and cervicothoracic junction, and thoracic and lumbar spine, were obtained without intravenous contrast. COMPARISON:  None Available. FINDINGS: MRI CERVICAL SPINE FINDINGS Alignment: Normal. Vertebrae: No fracture, evidence of discitis, or bone lesion. Cord: Normal cord signal. Posterior Fossa, vertebral arteries, paraspinal tissues: Negative. Disc levels: At C5-C6, left greater than right facet and uncovertebral  hypertrophy contributes to likely mild left foraminal stenosis on motion limited evaluation. Otherwise, no significant canal or foraminal stenosis in the cervical spine. MRI THORACIC SPINE FINDINGS Alignment:  Physiologic. Vertebrae: No fracture, evidence of discitis, or bone lesion. Cord:  Normal signal and morphology. Paraspinal and other soft tissues: Negative. Disc levels: No significant canal or foraminal stenosis. MRI LUMBAR SPINE FINDINGS Segmentation:  Standard. Alignment:  Physiologic. Vertebrae:  No fracture, evidence of discitis, or bone lesion. Conus medullaris and cauda equina: Conus extends to the L2 level. Conus and cauda equina appear normal. Paraspinal and other soft tissues: Negative. Disc levels: T12-L1 through L4-L5: No significant disc protrusion, foraminal stenosis, or canal stenosis. L5-S1: Mild disc desiccation height loss. Broad disc bulging with small superimposed superiorly directed disc protrusion. No significant canal or foraminal  stenosis. IMPRESSION: 1. At C5-C6, mild left foraminal stenosis. 2. At L5-S1, degenerative disc disease with small disc protrusion. 3. No significant canal or foraminal stenosis in the thoracic or lumbar spine. Electronically Signed   By: Gilmore GORMAN Molt M.D.   On: 04/08/2024 20:11   MR THORACIC SPINE WO CONTRAST Result Date: 04/08/2024 CLINICAL DATA:  Myelopathy, chronic, cervical spine; Low back pain, symptoms persist with > 6 wks treatment Lumbar radiculopathy, symptoms persist with > 6 wks treatment; Mid-back pain, neuro deficit Myelopathy, chronic, thoracic spine EXAM: MRI CERVICAL, THORACIC AND LUMBAR SPINE WITHOUT CONTRAST TECHNIQUE: Multiplanar and multiecho pulse sequences of the cervical spine, to include the craniocervical junction and cervicothoracic junction, and thoracic and lumbar spine, were obtained without intravenous contrast. COMPARISON:  None Available. FINDINGS: MRI CERVICAL SPINE FINDINGS Alignment: Normal. Vertebrae: No fracture,  evidence of discitis, or bone lesion. Cord: Normal cord signal. Posterior Fossa, vertebral arteries, paraspinal tissues: Negative. Disc levels: At C5-C6, left greater than right facet and uncovertebral hypertrophy contributes to likely mild left foraminal stenosis on motion limited evaluation. Otherwise, no significant canal or foraminal stenosis in the cervical spine. MRI THORACIC SPINE FINDINGS Alignment:  Physiologic. Vertebrae: No fracture, evidence of discitis, or bone lesion. Cord:  Normal signal and morphology. Paraspinal and other soft tissues: Negative. Disc levels: No significant canal or foraminal stenosis. MRI LUMBAR SPINE FINDINGS Segmentation:  Standard. Alignment:  Physiologic. Vertebrae:  No fracture, evidence of discitis, or bone lesion. Conus medullaris and cauda equina: Conus extends to the L2 level. Conus and cauda equina appear normal. Paraspinal and other soft tissues: Negative. Disc levels: T12-L1 through L4-L5: No significant disc protrusion, foraminal stenosis, or canal stenosis. L5-S1: Mild disc desiccation height loss. Broad disc bulging with small superimposed superiorly directed disc protrusion. No significant canal or foraminal stenosis. IMPRESSION: 1. At C5-C6, mild left foraminal stenosis. 2. At L5-S1, degenerative disc disease with small disc protrusion. 3. No significant canal or foraminal stenosis in the thoracic or lumbar spine. Electronically Signed   By: Gilmore GORMAN Molt M.D.   On: 04/08/2024 20:11   MR LUMBAR SPINE WO CONTRAST Result Date: 04/08/2024 CLINICAL DATA:  Myelopathy, chronic, cervical spine; Low back pain, symptoms persist with > 6 wks treatment Lumbar radiculopathy, symptoms persist with > 6 wks treatment; Mid-back pain, neuro deficit Myelopathy, chronic, thoracic spine EXAM: MRI CERVICAL, THORACIC AND LUMBAR SPINE WITHOUT CONTRAST TECHNIQUE: Multiplanar and multiecho pulse sequences of the cervical spine, to include the craniocervical junction and  cervicothoracic junction, and thoracic and lumbar spine, were obtained without intravenous contrast. COMPARISON:  None Available. FINDINGS: MRI CERVICAL SPINE FINDINGS Alignment: Normal. Vertebrae: No fracture, evidence of discitis, or bone lesion. Cord: Normal cord signal. Posterior Fossa, vertebral arteries, paraspinal tissues: Negative. Disc levels: At C5-C6, left greater than right facet and uncovertebral hypertrophy contributes to likely mild left foraminal stenosis on motion limited evaluation. Otherwise, no significant canal or foraminal stenosis in the cervical spine. MRI THORACIC SPINE FINDINGS Alignment:  Physiologic. Vertebrae: No fracture, evidence of discitis, or bone lesion. Cord:  Normal signal and morphology. Paraspinal and other soft tissues: Negative. Disc levels: No significant canal or foraminal stenosis. MRI LUMBAR SPINE FINDINGS Segmentation:  Standard. Alignment:  Physiologic. Vertebrae:  No fracture, evidence of discitis, or bone lesion. Conus medullaris and cauda equina: Conus extends to the L2 level. Conus and cauda equina appear normal. Paraspinal and other soft tissues: Negative. Disc levels: T12-L1 through L4-L5: No significant disc protrusion, foraminal stenosis, or canal stenosis. L5-S1: Mild disc desiccation height loss. Broad  disc bulging with small superimposed superiorly directed disc protrusion. No significant canal or foraminal stenosis. IMPRESSION: 1. At C5-C6, mild left foraminal stenosis. 2. At L5-S1, degenerative disc disease with small disc protrusion. 3. No significant canal or foraminal stenosis in the thoracic or lumbar spine. Electronically Signed   By: Gilmore GORMAN Molt M.D.   On: 04/08/2024 20:11   CT Head Wo Contrast Result Date: 04/08/2024 CLINICAL DATA:  Headache and neuro deficit. EXAM: CT HEAD WITHOUT CONTRAST TECHNIQUE: Contiguous axial images were obtained from the base of the skull through the vertex without intravenous contrast. RADIATION DOSE REDUCTION:  This exam was performed according to the departmental dose-optimization program which includes automated exposure control, adjustment of the mA and/or kV according to patient size and/or use of iterative reconstruction technique. COMPARISON:  None Available. FINDINGS: Brain: No intracranial hemorrhage, mass effect, or evidence of acute infarct. No hydrocephalus. No extra-axial fluid collection. Vascular: No hyperdense vessel or unexpected calcification. Skull: No fracture or focal lesion. Sinuses/Orbits: No acute finding. Other: None. IMPRESSION: No acute intracranial abnormality. Electronically Signed   By: Norman Gatlin M.D.   On: 04/08/2024 19:05     Procedures   Medications Ordered in the ED - No data to display                                  Medical Decision Making Amount and/or Complexity of Data Reviewed Labs: ordered. Radiology: ordered.   MRI is here today.  Therefore we will try to get MRI cervical spine thoracic spine lumbar spine just to rule out any lesions.  Symptoms in the neck area consistent perhaps with a cervical radiculopathy same may be for a right sciatica or lumbar radiculopathy.  But sort of strange that it is in these both locations.  Certainly no evidence of cauda equina on exam.  Will also get CT head for the new onset posterior headache.  Will get basic labs.  Patient does not have a primary care doctor here.  Does have follow-up with [facility] in 2 weeks.  Patient ambulating fine.  Head CT without any acute findings.  CBC white blood cell count 6.4 hemoglobin 12.9 platelets 337.  Complete metabolic panel pending.  Complete metabolic panel is normal.  No acute findings LFTs are normal electrolytes normal renal function normal.  MRI cervical spine thoracic spine lumbar spine pending.  Depending on MRI results.  Patient already has follow-up arranged with [facility].  May need referral to [facility] neurology as well.  Since patient does  not have a primary care doctor established.  MRIs results are back. MRI cervical spine, thoracic, lumbar spine: at C5-C6 mild left foraminal stenosis at L5-S1 degenerative disc disease with small disc protrusion no significant canal or foramen stenosis in the thoracic or lumbar spine.  No significant findings.  Will have patient keep her appointment with neurosurgery.  Will also will give a referral to neurology that she can set up an appointment which she can cancel if things get better.  Suspect patient will need may be some pain control.  Did note the patient ambulates very well without any obvious discomfort or difficulties here in the emergency department.   Final diagnoses:  Cervical radicular pain  Lumbar radicular pain    ED Discharge Orders     None          Geraldene Hamilton, MD 04/08/24 NELIDA    Geraldene Hamilton, MD 04/08/24  1950    Margareta Laureano, MD 04/08/24 2023

## 2024-04-08 NOTE — ED Notes (Signed)
 Patient transported to MRI

## 2024-04-08 NOTE — ED Triage Notes (Signed)
 Patient states seen here recently for back pain. States taking pain medicine everyday and pain is getting worse.

## 2024-04-28 ENCOUNTER — Encounter: Payer: Self-pay | Admitting: Family

## 2024-04-28 ENCOUNTER — Ambulatory Visit: Admitting: Family

## 2024-04-28 VITALS — BP 108/70 | HR 95 | Temp 98.6°F | Ht 63.78 in | Wt 110.8 lb

## 2024-04-28 DIAGNOSIS — F321 Major depressive disorder, single episode, moderate: Secondary | ICD-10-CM | POA: Diagnosis not present

## 2024-04-28 DIAGNOSIS — M542 Cervicalgia: Secondary | ICD-10-CM | POA: Diagnosis not present

## 2024-04-28 DIAGNOSIS — Z1159 Encounter for screening for other viral diseases: Secondary | ICD-10-CM | POA: Diagnosis not present

## 2024-04-28 DIAGNOSIS — M5136 Other intervertebral disc degeneration, lumbar region with discogenic back pain only: Secondary | ICD-10-CM | POA: Diagnosis not present

## 2024-04-28 DIAGNOSIS — R5383 Other fatigue: Secondary | ICD-10-CM

## 2024-04-28 DIAGNOSIS — Z114 Encounter for screening for human immunodeficiency virus [HIV]: Secondary | ICD-10-CM | POA: Diagnosis not present

## 2024-04-28 DIAGNOSIS — M4802 Spinal stenosis, cervical region: Secondary | ICD-10-CM

## 2024-04-28 LAB — FERRITIN: Ferritin: 41.7 ng/mL (ref 10.0–291.0)

## 2024-04-28 LAB — VITAMIN D 25 HYDROXY (VIT D DEFICIENCY, FRACTURES): VITD: 38.86 ng/mL (ref 30.00–100.00)

## 2024-04-28 MED ORDER — ESCITALOPRAM OXALATE 5 MG PO TABS: 5.0000 mg | ORAL_TABLET | Freq: Every day | ORAL | 1 refills | Status: DC

## 2024-04-28 NOTE — Patient Instructions (Signed)
 Welcome to [facility], It was a pleasure meeting you today!    As discussed, I have sent your new medicine to your pharmacy.  I have sent a referral to our therapy team for counseling. But you have to call their office to schedule your first appointment. Start with [facility] at [phone] if they can't schedule you quickly you can try [facility] at [phone].    Please schedule a 3 week follow up visit today, ok if virtual.    PLEASE NOTE: If you had any LAB tests please let us  know if you have not heard back within a few days. You may see your results on MyChart before we have a chance to review them but we will give you a call once they are reviewed by us . If we ordered any REFERRALS today, please let us  know if you have not heard from their office within the next week.  Let us  know through MyChart if you are needing REFILLS, or have your pharmacy send us  the request. You can also use MyChart to communicate with me or any office staff.  Please try these tips to maintain a healthy lifestyle: It is important that you exercise regularly at least 30 minutes 5 times a week. Think about what you will eat, plan ahead. Choose whole foods, & think  clean, green, fresh or frozen over canned, processed or packaged foods which are more sugary, salty, and fatty. 70 to 75% of food eaten should be fresh vegetables and protein. 2-3  meals daily with healthy snacks between meals, but must be whole fruit, protein or vegetables. Aim to eat over a 10 hour period when you are active, for example, 7am to 5pm, and then STOP after your last meal of the day, drinking only water.  Shorter eating windows, 6-8 hours, are showing benefits in heart disease and blood sugar regulation. Drink water every day! Shoot for 64 ounces daily = 8 cups, no other drink is as healthy! Fruit juice is best enjoyed in a healthy way, by EATING the fruit.

## 2024-04-28 NOTE — Progress Notes (Unsigned)
 New Patient Office Visit  Subjective:  Patient ID: Kelly Macdonald, female    DOB: 11/06/1980  Age: 43 y.o. MRN: [id number]  CC:  Chief Complaint  Patient presents with  . New Patient (Initial Visit)  . Neck Pain    Pt c/o neck pain that radiates down to left side and left leg. Present for 2 months. Has tried tylenol which does help slightly.   HPI Kelly Macdonald presents for establishing care today. Discussed the use of AI scribe software for clinical note transcription with the patient, who gave verbal consent to proceed.  History of Present Illness   Kelly Macdonald is a 43 year old female who presents with chronic neck pain and sleep disturbances.  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 No surgical indication. - Continue physical therapy with focus on pain-alleviating exercises and stretches. - Prescribe tizanidine for muscle relaxation; advise caution with driving until effects are known.  Major depressive disorder Symptoms include sadness and stress. No prior antidepressant use. Lexapro  chosen for fast onset. Potential side effects discussed. - Prescribe Lexapro  at lowest dose; monitor for side  effects. - Discuss therapy as additional support option.  Chronic insomnia Chronic insomnia possibly exacerbated by pain and stress. Advised against alprazolam due to addiction risk. - Prescribe Lexapro , which may improve sleep as depression improves. - Advise against regular alprazolam use for sleep.  History of anemia Current labs show normal hemoglobin but possible low iron stores. Heavy menstrual cycles may contribute. - Check ferritin level to assess iron stores.  General Health Maintenance Vitamin D  levels last checked in 2021. Low levels can affect mood and immunity. - Check vitamin D  level. - Recommend vitamin D  supplementation up to 4000 units daily if levels are low.  Follow-Up Follow-up needed to assess treatment response and review labs. - Schedule follow-up in three weeks, virtual or in-office, to assess Lexapro  response and review labs. - Review lab results via MyChart and communicate findings.        Subjective:    Outpatient Medications Prior to Visit  Medication Sig Dispense Refill  . ALPRAZolam (NIRAVAM) 1 MG dissolvable tablet Take 1 mg by mouth at bedtime as needed for anxiety.    . Ascorbic Acid (VITAMIN C PO) Take by mouth.    . Cyanocobalamin (VITAMIN B12 PO) Take by mouth.    . Cholecalciferol  1.25 MG (50000 UT) capsule Take one tablet by mouth three days a week for twelve weeks. 36 capsule 0  . methylPREDNISolone  (MEDROL  DOSEPAK) 4 MG TBPK tablet TAKE AS DIRECTED PER PACKAGE INSTRUCTIONS 21 tablet 0   No facility-administered medications prior to visit.   Past Medical History:  Diagnosis Date  . Anemia    Past Surgical History:  Procedure Laterality Date  . CESAREAN SECTION    . COLONOSCOPY  2017   In [place]    Objective:   Today's Vitals: Temp 98.6 F (37 C) (Temporal)   Ht 5' 3.78 (1.62 m)   Wt 110 lb 12.8 oz (50.3 kg)   LMP 04/17/2024 (Exact Date)   BMI 19.15 kg/m   Physical Exam Vitals and nursing note reviewed.   Constitutional:      Appearance: Normal appearance.  Cardiovascular:     Rate and Rhythm: Normal rate and regular rhythm.  Pulmonary:     Effort: Pulmonary effort is normal.     Breath sounds: Normal breath sounds.  Musculoskeletal:        General: Normal range of motion.  Skin:    General: Skin is warm and dry.  Neurological:     Mental Status: She is alert.  Psychiatric:        Mood and Affect: Mood normal.        Behavior: Behavior normal.     No orders of the defined types were placed in this encounter.   Lucius Krabbe, NP

## 2024-04-29 LAB — HIV ANTIBODY (ROUTINE TESTING W REFLEX): HIV 1&2 Ab, 4th Generation: NONREACTIVE

## 2024-04-29 LAB — HEPATITIS C ANTIBODY: Hepatitis C Ab: NONREACTIVE

## 2024-04-30 ENCOUNTER — Encounter: Payer: Self-pay | Admitting: Family

## 2024-04-30 ENCOUNTER — Ambulatory Visit: Payer: Self-pay | Admitting: Family

## 2024-04-30 DIAGNOSIS — M4802 Spinal stenosis, cervical region: Secondary | ICD-10-CM | POA: Insufficient documentation

## 2024-04-30 DIAGNOSIS — M5136 Other intervertebral disc degeneration, lumbar region with discogenic back pain only: Secondary | ICD-10-CM | POA: Insufficient documentation

## 2024-04-30 DIAGNOSIS — M48061 Spinal stenosis, lumbar region without neurogenic claudication: Secondary | ICD-10-CM | POA: Insufficient documentation

## 2024-04-30 NOTE — Assessment & Plan Note (Signed)
 Chronic pain due to degenerative changes with herniation and stenosis confirmed by imaging. No surgical indication. - Continue physical therapy with focus on pain-alleviating exercises and stretches. - Take tizanidine 2mg  given by neurosurgeon for muscle relaxation; advise caution with driving until effects are known. - F/U with neurosurgeon prn

## 2024-04-30 NOTE — Progress Notes (Signed)
 Labs all normal, iron level and HIV & Hep C screenings are negative. Her vitamin D  level is normal but on the low end, should be taking up to 4,000 units or 100mcg daily, over the counter, look for D3 for better absorption. Thx

## 2024-04-30 NOTE — Assessment & Plan Note (Signed)
 Symptoms include sadness and stress. PHQ 14.  No prior antidepressant use. Lexapro  chosen for fast onset. Potential side effects discussed. - Prescribe Lexapro  5mg  qd; reviewed possible side effects. - Discussed therapy as additional support option and provided number for behavioral health to call. - F/U in 3 weeks, ok if virtual

## 2024-05-07 ENCOUNTER — Ambulatory Visit: Admitting: Family

## 2024-05-10 ENCOUNTER — Ambulatory Visit: Admitting: Family

## 2024-05-10 ENCOUNTER — Encounter: Payer: Self-pay | Admitting: Family

## 2024-05-10 VITALS — BP 99/69 | HR 77 | Temp 97.5°F | Ht 63.78 in | Wt 111.2 lb

## 2024-05-10 DIAGNOSIS — F321 Major depressive disorder, single episode, moderate: Secondary | ICD-10-CM | POA: Diagnosis not present

## 2024-05-10 MED ORDER — SERTRALINE HCL 25 MG PO TABS: 25.0000 mg | ORAL_TABLET | Freq: Every day | ORAL | 1 refills | Status: DC

## 2024-05-10 NOTE — Progress Notes (Signed)
 Patient ID: Kelly Macdonald, female    DOB: 09/17/1980, 43 y.o.   MRN: [id number]  Chief Complaint  Patient presents with   Fatigue    Pt c/o fatigue.     Neck Pain  Discussed the use of AI scribe software for clinical note transcription with the patient, who gave verbal consent to proceed.  History of Present Illness   Kelly Macdonald is a 43 year old female who presents with a sore throat and vision changes.  Pharyngalgia - Sore throat for almost one month - Pain primarily on the right side - Persistent pain without resolution - No associated runny nose or heartburn - No fever, but experiences chills and feels cold at times  Visual disturbance - Intermittent blurry vision in the right eye - No visual symptoms in the left eye - Blurry vision appears to be temporally associated with right-sided throat pain - Does not wear corrective lenses  Sleep disturbance and fatigue - Insomnia with difficulty maintaining sleep, often waking after two hours and unable to return to sleep - Takes a muscle relaxant at bedtime to aid with neck pain & falling asleep - Previously used alprazolam for sleep - Feels tired and lacks energy  Mood symptoms - History of depression - Discontinued escitalopram  after one dose due to heart palpitations and insomnia at night  Laboratory abnormalities - Anemia is improving - Recent laboratory evaluation showed borderline low vitamin D  levels, though wnl     Assessment & Plan:  Assessment and Plan    Chronic right-sided sore throat Persistent sore throat for a month, primarily on the right side. No fever, or other viral or allergy sx, no significant redness. No thyromegaly noted. - Advised on warm salt water gargles tid prn - Can take up to 600mg  OTC Ibuprofen tid prn - Call office if sx are persisting  Right eye visual disturbance Intermittent unclear vision in the right eye. No dryness, drainage, redness, or burning. No glasses worn. Symptoms not  constant. - Encouraged eye exam with optometrist or opthalmologist  Depression Depression causing fatigue and poor sleep. Previous escitalopram  trial caused heart palpitations and insomnia. Discussed alternative antidepressants. Sertraline  25 mg prescribed with potential side effects. Therapy recommended. - Prescribe sertraline  25 mg, to be taken at night. - Encourage follow-up with therapist for counseling. - F/U in 3 weeks, ok if virtual  Fatigue Persistent fatigue possibly related to depression, poor sleep, no exercise, and low vitamin D . Disrupted sleep pattern with difficulty maintaining sleep. Previous use of alprazolam and muscle relaxers noted. Recent CBC showed resolving anemia, Hgb wnl. - Encourage follow-up with therapist for counseling. - Check TSH today to evaluate thyroid  function.  Vitamin D  deficiency, borderline Borderline low vitamin D  levels possibly contributing to fatigue. - Advise taking vitamin D  supplements, 2 tablets per day, equals 2,000units. - Recheck level in 6 months.     Subjective:    Outpatient Medications Prior to Visit  Medication Sig Dispense Refill   Ascorbic Acid (VITAMIN C PO) Take by mouth.     Cyanocobalamin (VITAMIN B12 PO) Take by mouth.     escitalopram  (LEXAPRO ) 5 MG tablet Take 1 tablet (5 mg total) by mouth daily. 30 tablet 1   tiZANidine (ZANAFLEX) 2 MG tablet Take by mouth.     ALPRAZolam (NIRAVAM) 1 MG dissolvable tablet Take 1 mg by mouth at bedtime as needed for anxiety. (Patient not taking: Reported on 05/10/2024)     No facility-administered medications prior to visit.   Past  Medical History:  Diagnosis Date   Anemia    LUQ abdominal pain 12/24/2017   Neck pain 04/28/2024   Past Surgical History:  Procedure Laterality Date   CESAREAN SECTION     COLONOSCOPY  2017   In [place]   No Known Allergies    Objective:    Physical Exam Vitals and nursing note reviewed.  Constitutional:      Appearance: Normal appearance.   Cardiovascular:     Rate and Rhythm: Normal rate and regular rhythm.  Pulmonary:     Effort: Pulmonary effort is normal.     Breath sounds: Normal breath sounds.  Musculoskeletal:        General: Normal range of motion.  Skin:    General: Skin is warm and dry.  Neurological:     Mental Status: She is alert.  Psychiatric:        Mood and Affect: Mood normal.        Behavior: Behavior normal.    BP 99/69 (BP Location: Left Arm, Patient Position: Sitting, Cuff Size: Normal)   Pulse 77   Temp (!) 97.5 F (36.4 C) (Temporal)   Ht 5' 3.78 (1.62 m)   Wt 111 lb 4 oz (50.5 kg)   LMP 04/17/2024 (Exact Date)   SpO2 99%   BMI 19.23 kg/m  Wt Readings from Last 3 Encounters:  05/10/24 111 lb 4 oz (50.5 kg)  04/28/24 110 lb 12.8 oz (50.3 kg)  02/24/20 114 lb (51.7 kg)      Lucius Krabbe, NP

## 2024-05-10 NOTE — Assessment & Plan Note (Signed)
 Depression causing fatigue and poor sleep. Previous escitalopram  trial caused heart palpitations and insomnia. Discussed alternative antidepressants. Sertraline  25 mg prescribed with potential side effects. Therapy recommended. - Prescribe sertraline  25 mg, to be taken at night. - Encourage follow-up with therapist for counseling. - F/U in 3 weeks, ok if virtual

## 2024-05-11 ENCOUNTER — Other Ambulatory Visit (INDEPENDENT_AMBULATORY_CARE_PROVIDER_SITE_OTHER)

## 2024-05-11 DIAGNOSIS — F321 Major depressive disorder, single episode, moderate: Secondary | ICD-10-CM | POA: Diagnosis not present

## 2024-05-11 LAB — TSH: TSH: 1.01 u[IU]/mL (ref 0.35–5.50)

## 2024-05-12 ENCOUNTER — Ambulatory Visit: Payer: Self-pay | Admitting: Family

## 2024-05-12 NOTE — Progress Notes (Signed)
 Thyroid  level is normal. Has she started the zoloft  (sertraline )? Should have a 3 week follow up scheduled, ok if virtual. Thx

## 2024-06-08 ENCOUNTER — Other Ambulatory Visit: Payer: Self-pay

## 2024-06-08 ENCOUNTER — Emergency Department (HOSPITAL_BASED_OUTPATIENT_CLINIC_OR_DEPARTMENT_OTHER)
Admission: EM | Admit: 2024-06-08 | Discharge: 2024-06-08 | Discharge status: Against Medical Advice | Attending: Emergency Medicine | Admitting: Emergency Medicine

## 2024-06-08 ENCOUNTER — Encounter (HOSPITAL_BASED_OUTPATIENT_CLINIC_OR_DEPARTMENT_OTHER): Payer: Self-pay | Admitting: Emergency Medicine

## 2024-06-08 DIAGNOSIS — Z5321 Procedure and treatment not carried out due to patient leaving prior to being seen by health care provider: Secondary | ICD-10-CM | POA: Diagnosis not present

## 2024-06-08 DIAGNOSIS — J029 Acute pharyngitis, unspecified: Secondary | ICD-10-CM | POA: Diagnosis present

## 2024-06-08 NOTE — ED Triage Notes (Signed)
 Pt via pov from home with sore throat x 2 months. She states she was seen here for the same; has seen her pcp, who checked her thyroid , which was normal. She reports that it gets tight, especially on the right side, and sometimes it affects her breathing. Pt a&o x 4; nad noted.

## 2024-06-09 ENCOUNTER — Ambulatory Visit: Admitting: Family

## 2024-06-22 ENCOUNTER — Other Ambulatory Visit (HOSPITAL_BASED_OUTPATIENT_CLINIC_OR_DEPARTMENT_OTHER): Payer: Self-pay

## 2024-07-04 ENCOUNTER — Other Ambulatory Visit: Payer: Self-pay | Admitting: Family

## 2024-07-04 DIAGNOSIS — F321 Major depressive disorder, single episode, moderate: Secondary | ICD-10-CM

## 2024-07-05 ENCOUNTER — Ambulatory Visit: Admitting: Family

## 2024-07-05 ENCOUNTER — Encounter: Payer: Self-pay | Admitting: Family

## 2024-07-05 VITALS — BP 107/67 | HR 81 | Temp 97.2°F | Ht 64.0 in | Wt 109.0 lb

## 2024-07-05 DIAGNOSIS — F321 Major depressive disorder, single episode, moderate: Secondary | ICD-10-CM | POA: Diagnosis not present

## 2024-07-05 DIAGNOSIS — R070 Pain in throat: Secondary | ICD-10-CM

## 2024-07-05 DIAGNOSIS — R0982 Postnasal drip: Secondary | ICD-10-CM

## 2024-07-05 MED ORDER — LIDOCAINE VISCOUS HCL 2 % MT SOLN: 15.0000 mL | OROMUCOSAL | 0 refills | Status: AC | PRN

## 2024-07-05 MED ORDER — SERTRALINE HCL 50 MG PO TABS: 50.0000 mg | ORAL_TABLET | Freq: Every day | ORAL | 2 refills | Status: AC

## 2024-07-05 MED ORDER — TRIAMCINOLONE ACETONIDE 55 MCG/ACT NA AERO: 1.0000 | INHALATION_SPRAY | Freq: Every day | NASAL | 2 refills | Status: AC

## 2024-07-05 NOTE — Progress Notes (Signed)
 Patient ID: Kelly Macdonald, female    DOB: 05-Jan-1981, 43 y.o.   MRN: [id number]  Chief Complaint  Patient presents with   Sore Throat    Pt c/o right sided sore throat, present for 3 months.   Discussed the use of AI scribe software for clinical note transcription with the patient, who gave verbal consent to proceed.  History of Present Illness A 42 year old female who presents for a refill of her antidepressant medication and persistent throat pain.  She has been taking Zoloft  for depressive symptoms without improvement and is currently out of medication. She experiences throat pain with a sensation on the right side of her throat, occasional mucus sensation, and cough. She does not frequently clear her throat, does not experience heartburn, and has not noticed post-nasal drip.  Assessment & Plan Depression Current low-dose Zoloft  25mg  ineffective. Denies any SE. - Increased Zoloft  to 50 mg daily. - Schedule follow-up in 1 month to reassess response.  Throat pain w/occasional cough Persistent pain for 2 months. Mild erythema noted on right posterior pharynx, not on tonsil. No tonsil hypertrophy or exudate noted. No excessive postnasal drip or ulcerations noted.  - Prescribed lidocaine 2% oral rinse to use q3h prn for pain. - Refer to ENT for further evaluation. - Prescribed generic Nasacort nasal spray for possible excess mucus which could be contributing cough.  Subjective:    Outpatient Medications Prior to Visit  Medication Sig Dispense Refill   Ascorbic Acid (VITAMIN C PO) Take by mouth.     Cholecalciferol  (VITAMIN D -3) 25 MCG (1000 UT) CAPS Take 2 capsules by mouth daily at 12 noon.     Cyanocobalamin (VITAMIN B12 PO) Take by mouth.     sertraline  (ZOLOFT ) 25 MG tablet TAKE 1 TABLET (25 MG TOTAL) BY MOUTH DAILY. 30 tablet 1   tiZANidine (ZANAFLEX) 2 MG tablet Take by mouth.     No facility-administered medications prior to visit.   Past Medical History:  Diagnosis  Date   Anemia    LUQ abdominal pain 12/24/2017   Neck pain 04/28/2024   Past Surgical History:  Procedure Laterality Date   CESAREAN SECTION     COLONOSCOPY  2017   In [place]   No Known Allergies    Objective:    Physical Exam Vitals and nursing note reviewed.  Constitutional:      Appearance: Normal appearance.  HENT:     Mouth/Throat:     Mouth: Mucous membranes are moist.     Tongue: No lesions.     Palate: No mass and lesions.     Pharynx: Posterior oropharyngeal erythema (very mild on right side) present. No pharyngeal swelling, oropharyngeal exudate, uvula swelling or postnasal drip.     Tonsils: No tonsillar exudate or tonsillar abscesses.  Cardiovascular:     Rate and Rhythm: Normal rate and regular rhythm.  Pulmonary:     Effort: Pulmonary effort is normal.     Breath sounds: Normal breath sounds.  Musculoskeletal:        General: Normal range of motion.  Lymphadenopathy:     Head:     Right side of head: No submandibular, tonsillar, preauricular or posterior auricular adenopathy.     Left side of head: No submandibular, tonsillar, preauricular or posterior auricular adenopathy.     Cervical: No cervical adenopathy.  Skin:    General: Skin is warm and dry.  Neurological:     Mental Status: She is alert.  Psychiatric:  Mood and Affect: Mood normal.        Behavior: Behavior normal.    BP 107/67 (BP Location: Left Arm, Patient Position: Sitting, Cuff Size: Normal)   Pulse 81   Temp (!) 97.2 F (36.2 C) (Temporal)   Ht 5' 4 (1.626 m)   Wt 109 lb (49.4 kg)   LMP 06/06/2024 (Approximate)   SpO2 99%   BMI 18.71 kg/m  Wt Readings from Last 3 Encounters:  07/05/24 109 lb (49.4 kg)  06/08/24 111 lb 5.3 oz (50.5 kg)  05/10/24 111 lb 4 oz (50.5 kg)      Lucius Krabbe, NP

## 2024-08-13 ENCOUNTER — Encounter (INDEPENDENT_AMBULATORY_CARE_PROVIDER_SITE_OTHER): Payer: Self-pay
